# Patient Record
Sex: Male | Born: 1950 | Race: Black or African American | Hispanic: No | Marital: Married | Smoking: Never smoker
Health system: Southern US, Community
[De-identification: ages and names within clinical notes are randomized; demographics above are authoritative.]

## PROBLEM LIST (undated history)

## (undated) DIAGNOSIS — E079 Disorder of thyroid, unspecified: Secondary | ICD-10-CM

## (undated) DIAGNOSIS — I2699 Other pulmonary embolism without acute cor pulmonale: Secondary | ICD-10-CM

## (undated) DIAGNOSIS — I5031 Acute diastolic (congestive) heart failure: Secondary | ICD-10-CM

## (undated) DIAGNOSIS — G4733 Obstructive sleep apnea (adult) (pediatric): Secondary | ICD-10-CM

## (undated) DIAGNOSIS — I10 Essential (primary) hypertension: Secondary | ICD-10-CM

## (undated) DIAGNOSIS — N289 Disorder of kidney and ureter, unspecified: Secondary | ICD-10-CM

## (undated) DIAGNOSIS — M1990 Unspecified osteoarthritis, unspecified site: Secondary | ICD-10-CM

## (undated) DIAGNOSIS — J9601 Acute respiratory failure with hypoxia: Secondary | ICD-10-CM

## (undated) DIAGNOSIS — E119 Type 2 diabetes mellitus without complications: Secondary | ICD-10-CM

## (undated) HISTORY — PX: CHOLECYSTECTOMY: SHX55

## (undated) HISTORY — PX: APPENDECTOMY: SHX54

## (undated) HISTORY — PX: SALIVARY GLAND SURGERY: SHX768

---

## 2012-10-28 DIAGNOSIS — E1142 Type 2 diabetes mellitus with diabetic polyneuropathy: Secondary | ICD-10-CM | POA: Insufficient documentation

## 2013-08-16 DIAGNOSIS — E039 Hypothyroidism, unspecified: Secondary | ICD-10-CM | POA: Insufficient documentation

## 2014-01-16 ENCOUNTER — Emergency Department (HOSPITAL_BASED_OUTPATIENT_CLINIC_OR_DEPARTMENT_OTHER): Payer: Medicare HMO

## 2014-01-16 ENCOUNTER — Encounter (HOSPITAL_BASED_OUTPATIENT_CLINIC_OR_DEPARTMENT_OTHER): Payer: Self-pay | Admitting: Emergency Medicine

## 2014-01-16 ENCOUNTER — Emergency Department (HOSPITAL_BASED_OUTPATIENT_CLINIC_OR_DEPARTMENT_OTHER)
Admission: EM | Admit: 2014-01-16 | Discharge: 2014-01-16 | Disposition: A | Discharge status: Home / Self Care | Payer: Medicare HMO | Attending: Emergency Medicine | Admitting: Emergency Medicine

## 2014-01-16 DIAGNOSIS — Z79899 Other long term (current) drug therapy: Secondary | ICD-10-CM | POA: Insufficient documentation

## 2014-01-16 DIAGNOSIS — IMO0002 Reserved for concepts with insufficient information to code with codable children: Secondary | ICD-10-CM | POA: Diagnosis not present

## 2014-01-16 DIAGNOSIS — Y939 Activity, unspecified: Secondary | ICD-10-CM | POA: Insufficient documentation

## 2014-01-16 DIAGNOSIS — Y929 Unspecified place or not applicable: Secondary | ICD-10-CM | POA: Diagnosis not present

## 2014-01-16 DIAGNOSIS — R109 Unspecified abdominal pain: Secondary | ICD-10-CM

## 2014-01-16 DIAGNOSIS — M129 Arthropathy, unspecified: Secondary | ICD-10-CM | POA: Diagnosis not present

## 2014-01-16 DIAGNOSIS — A599 Trichomoniasis, unspecified: Secondary | ICD-10-CM | POA: Insufficient documentation

## 2014-01-16 DIAGNOSIS — E079 Disorder of thyroid, unspecified: Secondary | ICD-10-CM | POA: Diagnosis not present

## 2014-01-16 DIAGNOSIS — Z9089 Acquired absence of other organs: Secondary | ICD-10-CM | POA: Insufficient documentation

## 2014-01-16 DIAGNOSIS — E119 Type 2 diabetes mellitus without complications: Secondary | ICD-10-CM | POA: Diagnosis not present

## 2014-01-16 DIAGNOSIS — S39011A Strain of muscle, fascia and tendon of abdomen, initial encounter: Secondary | ICD-10-CM

## 2014-01-16 DIAGNOSIS — S3981XA Other specified injuries of abdomen, initial encounter: Secondary | ICD-10-CM | POA: Diagnosis present

## 2014-01-16 DIAGNOSIS — I10 Essential (primary) hypertension: Secondary | ICD-10-CM | POA: Diagnosis not present

## 2014-01-16 HISTORY — DX: Unspecified osteoarthritis, unspecified site: M19.90

## 2014-01-16 HISTORY — DX: Essential (primary) hypertension: I10

## 2014-01-16 HISTORY — DX: Type 2 diabetes mellitus without complications: E11.9

## 2014-01-16 HISTORY — DX: Disorder of thyroid, unspecified: E07.9

## 2014-01-16 LAB — COMPREHENSIVE METABOLIC PANEL
ALBUMIN: 3.5 g/dL (ref 3.5–5.2)
ALK PHOS: 69 U/L (ref 39–117)
ALT: 39 U/L (ref 0–53)
ANION GAP: 13 (ref 5–15)
AST: 36 U/L (ref 0–37)
BUN: 19 mg/dL (ref 6–23)
CO2: 28 mEq/L (ref 19–32)
Calcium: 9.7 mg/dL (ref 8.4–10.5)
Chloride: 102 mEq/L (ref 96–112)
Creatinine, Ser: 2 mg/dL — ABNORMAL HIGH (ref 0.50–1.35)
GFR calc Af Amer: 39 mL/min — ABNORMAL LOW (ref >90)
GFR calc non Af Amer: 34 mL/min — ABNORMAL LOW (ref >90)
Glucose, Bld: 227 mg/dL — ABNORMAL HIGH (ref 70–99)
POTASSIUM: 3.9 meq/L (ref 3.7–5.3)
SODIUM: 143 meq/L (ref 137–147)
TOTAL PROTEIN: 7.8 g/dL (ref 6.0–8.3)
Total Bilirubin: 0.4 mg/dL (ref 0.3–1.2)

## 2014-01-16 LAB — CBC
HCT: 39.3 % (ref 39.0–52.0)
Hemoglobin: 13.6 g/dL (ref 13.0–17.0)
MCH: 26.6 pg (ref 26.0–34.0)
MCHC: 34.6 g/dL (ref 30.0–36.0)
MCV: 76.9 fL — ABNORMAL LOW (ref 78.0–100.0)
Platelets: 188 10*3/uL (ref 150–400)
RBC: 5.11 MIL/uL (ref 4.22–5.81)
RDW: 16.2 % — AB (ref 11.5–15.5)
WBC: 7.6 10*3/uL (ref 4.0–10.5)

## 2014-01-16 LAB — URINE MICROSCOPIC-ADD ON

## 2014-01-16 LAB — URINALYSIS, ROUTINE W REFLEX MICROSCOPIC
Bilirubin Urine: NEGATIVE
Glucose, UA: NEGATIVE mg/dL
Hgb urine dipstick: NEGATIVE
Ketones, ur: NEGATIVE mg/dL
NITRITE: NEGATIVE
PH: 5.5 (ref 5.0–8.0)
Protein, ur: NEGATIVE mg/dL
SPECIFIC GRAVITY, URINE: 1.009 (ref 1.005–1.030)
Urobilinogen, UA: 0.2 mg/dL (ref 0.0–1.0)

## 2014-01-16 MED ORDER — OXYCODONE-ACETAMINOPHEN 5-325 MG PO TABS: 1.0000 | ORAL_TABLET | Freq: Four times a day (QID) | ORAL | Status: DC | PRN

## 2014-01-16 MED ORDER — MORPHINE SULFATE 4 MG/ML IJ SOLN
4.0000 mg | Freq: Once | INTRAMUSCULAR | Status: AC
  Administered 2014-01-16: 4 mg via INTRAVENOUS
  Filled 2014-01-16: qty 1

## 2014-01-16 MED ORDER — DOCUSATE SODIUM 100 MG PO CAPS: 100.0000 mg | ORAL_CAPSULE | Freq: Two times a day (BID) | ORAL | Status: DC

## 2014-01-16 MED ORDER — POLYETHYLENE GLYCOL 3350 17 G PO PACK: 17.0000 g | PACK | Freq: Every day | ORAL | Status: DC

## 2014-01-16 MED ORDER — METRONIDAZOLE 500 MG PO TABS: 500.0000 mg | ORAL_TABLET | Freq: Two times a day (BID) | ORAL | Status: DC

## 2014-01-16 NOTE — ED Notes (Signed)
PA at bedside discussing test results and dispo plan of care. 

## 2014-01-16 NOTE — ED Notes (Signed)
Pt [facility], c/o RLQ pain x 10 days. Denies painful urination. Radiates to back

## 2014-01-16 NOTE — ED Provider Notes (Signed)
CSN: [id number]     Arrival date & time 01/16/14  1423 History   First MD Initiated Contact with Patient 01/16/14 1429     Chief Complaint  Patient presents with  . Abdominal Pain     (Consider location/radiation/quality/duration/timing/severity/associated sxs/prior Treatment) HPI Comments: Patient is a 63 year old male with a past medical history of hypertension, diabetes, thyroid disease and arthritis who presents to the emergency department the EMS complaining of right lower quadrant and right-sided flank pain x10 days, worsening earlier this morning when he woke up to use the restroom. Initially the pain was described as a "soreness", however this morning it increased in severity rated 10 out of 10, nonradiating. States he did not have any difficulty urinating when he used the restroom this morning and again on arrival to the emergency department. No aggravating or alleviating factors. Denies increased frequency, urgency or dysuria. Denies any changes in bowel habits. Denies testicular pain or swelling. No history of kidney stones. Denies chest pain, shortness of breath, nausea, vomiting, fever or chills. History of cholecystectomy and appendectomy.  Patient is a 63 y.o. male presenting with abdominal pain. The history is provided by the patient.  Abdominal Pain   Past Medical History  Diagnosis Date  . Hypertension   . Diabetes mellitus without complication   . Thyroid disease   . Arthritis    Past Surgical History  Procedure Laterality Date  . Cholecystectomy     No family history on file. History  Substance Use Topics  . Smoking status: Never Smoker   . Smokeless tobacco: Current User    Types: Chew  . Alcohol Use: No    Review of Systems  Gastrointestinal: Positive for abdominal pain.  Genitourinary: Positive for flank pain.  All other systems reviewed and are negative.     Allergies  Review of patient's allergies indicates no known allergies.  Home Medications     Prior to Admission medications   Medication Sig Start Date End Date Taking? Authorizing Provider  amLODipine (NORVASC) 10 MG tablet Take 10 mg by mouth daily.   Yes Historical Provider, MD  febuxostat (ULORIC) 40 MG tablet Take 40 mg by mouth daily.   Yes Historical Provider, MD  glipiZIDE (GLUCOTROL) 10 MG tablet Take 10 mg by mouth daily before breakfast.   Yes Historical Provider, MD  levothyroxine (SYNTHROID, LEVOTHROID) 100 MCG tablet Take 100 mcg by mouth daily before breakfast.   Yes Historical Provider, MD  lovastatin (MEVACOR) 40 MG tablet Take 40 mg by mouth at bedtime.   Yes Historical Provider, MD  quinapril (ACCUPRIL) 10 MG tablet Take 10 mg by mouth daily.   Yes Historical Provider, MD  torsemide (DEMADEX) 10 MG tablet Take 10 mg by mouth daily.   Yes Historical Provider, MD  docusate sodium (COLACE) 100 MG capsule Take 1 capsule (100 mg total) by mouth every 12 (twelve) hours. 01/16/14   Trevor Mace, PA-C  oxyCODONE-acetaminophen (PERCOCET) 5-325 MG per tablet Take 1-2 tablets by mouth every 6 (six) hours as needed for severe pain. 01/16/14   Trevor Mace, PA-C  polyethylene glycol (MIRALAX / GLYCOLAX) packet Take 17 g by mouth daily. 01/16/14   Trevor Mace, PA-C   BP 116/68  Pulse 83  Temp(Src) 98.1 F (36.7 C) (Oral)  Resp 16  Ht  (1.778 m)  Wt 293 lb (132.904 kg)  BMI 42.04 kg/m2  SpO2 96% Physical Exam  Nursing note and vitals reviewed. Constitutional: He is oriented to person, place,  and time. He appears well-developed and well-nourished. No distress.  HENT:  Head: Normocephalic and atraumatic.  Mouth/Throat: Oropharynx is clear and moist.  Eyes: Conjunctivae are normal.  Neck: Normal range of motion. Neck supple.  Cardiovascular: Normal rate, regular rhythm and normal heart sounds.   Pulmonary/Chest: Effort normal and breath sounds normal.  Abdominal: Soft. Normal appearance and bowel sounds are normal. He exhibits no distension and no pulsatile  midline mass. There is no rigidity, no rebound and no guarding.    No peritoneal signs.  Musculoskeletal: Normal range of motion. He exhibits no edema.  Neurological: He is alert and oriented to person, place, and time.  Skin: Skin is warm and dry. He is not diaphoretic.  Psychiatric: He has a normal mood and affect. His behavior is normal.    ED Course  Procedures (including critical care time) Labs Review Labs Reviewed  CBC - Abnormal; Notable for the following:    MCV 76.9 (*)    RDW 16.2 (*)    All other components within normal limits  COMPREHENSIVE METABOLIC PANEL - Abnormal; Notable for the following:    Glucose, Bld 227 (*)    Creatinine, Ser 2.00 (*)    GFR calc non Af Amer 34 (*)    GFR calc Af Amer 39 (*)    All other components within normal limits  URINALYSIS, ROUTINE W REFLEX MICROSCOPIC - Abnormal; Notable for the following:    Leukocytes, UA TRACE (*)    All other components within normal limits  URINE MICROSCOPIC-ADD ON - Abnormal; Notable for the following:    Bacteria, UA FEW (*)    All other components within normal limits  URINE CULTURE    Imaging Review Ct Renal Stone Study  01/16/2014   CLINICAL DATA:  Right flank pain x2 weeks  EXAM: CT RENAL STONE PROTOCOL  TECHNIQUE: Multidetector CT imaging of the abdomen and pelvis was performed following the standard protocol without intravenous contrast  COMPARISON:  None.  FINDINGS: Lung bases are clear.  Moderate hepatic steatosis.  Spleen, pancreas, and adrenal glands are within normal limits.  Status post cholecystectomy.  Kidneys are unremarkable.  No renal calculi or hydronephrosis.  No evidence of bowel obstruction. Colonic diverticulosis, without associated inflammatory changes. Prior appendectomy.  No evidence of abdominal aortic aneurysm.  No abdominopelvic ascites.  No suspicious abdominopelvic lymphadenopathy.  Prostatomegaly, with enlargement of the central gland which indents the base of the bladder.  No  ureteral or bladder calculi.  Bladder is within normal limits.  Degenerative changes of the visualized thoracolumbar spine.  IMPRESSION: No renal, ureteral, or bladder calculi.  No hydronephrosis.  No evidence of bowel obstruction.  Prior appendectomy.  Moderate hepatic steatosis.   Electronically Signed   By: Charline Bills M.D.   On: 01/16/2014 15:58     EKG Interpretation None      MDM   Final diagnoses:  Abdominal wall strain, initial encounter  Flank pain   Patient presenting with right-sided flank and abdominal pain. He is nontoxic appearing and in no apparent distress. Afebrile, vital signs stable. Right-sided tenderness noted without peritoneal signs. Labs, CT abd/pelvis without contrast pending. 4:17 PM CT scan without any acute finding. No leukocytosis. Trace leukocytes, few bacteria and 3-6 white blood cells on urinalysis with rare squamous epithelial cells. Trichomonas present. Urine culture pending. Will treat Trichomonas with Flagyl. I discussed these findings with patient, he reports he has not been sexually active "for years", however agreeable the antibiotic. No urinary symptoms. Patient  is aware of his poor renal functions, his primary care physician is following this. When discussing CT scan results, and managing possibility of a muscle strain, patient reports he had some constipation a few days ago causing him to strain significant amount. No hernias palpated. Patient is stable for discharge with antibiotic, short course of Percocet, Colace and MiraLax. He will followup with his PCP. Return precautions given. Patient states understanding of treatment care plan and is agreeable.  Trevor Mace, PA-C 01/16/14 1624

## 2014-01-16 NOTE — ED Provider Notes (Signed)
Medical screening examination/treatment/procedure(s) were performed by non-physician practitioner and as supervising physician I was immediately available for consultation/collaboration.   EKG Interpretation None        Toy Cookey, MD 01/16/14 1624

## 2014-01-16 NOTE — Discharge Instructions (Signed)
Take percocet for severe pain only. No driving or operating heavy machinery while taking percocet. This medication may cause drowsiness. Take miralax as prescribed to prevent constipation along with colace. Take antibiotic to completion. You are obligated to inform your partner about trichomoniasis.  Abdominal Pain Many things can cause abdominal pain. Usually, abdominal pain is not caused by a disease and will improve without treatment. It can often be observed and treated at home. Your health care provider will do a physical exam and possibly order blood tests and X-rays to help determine the seriousness of your pain. However, in many cases, more time must pass before a clear cause of the pain can be found. Before that point, your health care provider may not know if you need more testing or further treatment. HOME CARE INSTRUCTIONS  Monitor your abdominal pain for any changes. The following actions may help to alleviate any discomfort you are experiencing:  Only take over-the-counter or prescription medicines as directed by your health care provider.  Do not take laxatives unless directed to do so by your health care provider.  Try a clear liquid diet (broth, tea, or water) as directed by your health care provider. Slowly move to a bland diet as tolerated. SEEK MEDICAL CARE IF:  You have unexplained abdominal pain.  You have abdominal pain associated with nausea or diarrhea.  You have pain when you urinate or have a bowel movement.  You experience abdominal pain that wakes you in the night.  You have abdominal pain that is worsened or improved by eating food.  You have abdominal pain that is worsened with eating fatty foods.  You have a fever. SEEK IMMEDIATE MEDICAL CARE IF:   Your pain does not go away within 2 hours.  You keep throwing up (vomiting).  Your pain is felt only in portions of the abdomen, such as the right side or the left lower portion of the abdomen.  You pass  bloody or black tarry stools. MAKE SURE YOU:  Understand these instructions.   Will watch your condition.   Will get help right away if you are not doing well or get worse.  Document Released: 02/17/2005 Document Revised: 05/15/2013 Document Reviewed: 01/17/2013 [facility] Patient Information 2015 [place], [place]. This information is not intended to replace advice given to you by your health care provider. Make sure you discuss any questions you have with your health care provider.  Flank Pain Flank pain refers to pain that is located on the side of the body between the upper abdomen and the back. The pain may occur over a short period of time (acute) or may be long-term or reoccurring (chronic). It may be mild or severe. Flank pain can be caused by many things. CAUSES  Some of the more common causes of flank pain include:  Muscle strains.   Muscle spasms.   A disease of your spine (vertebral disk disease).   A lung infection (pneumonia).   Fluid around your lungs (pulmonary edema).   A kidney infection.   Kidney stones.   A very painful skin rash caused by the chickenpox virus (shingles).   Gallbladder disease.  HOME CARE INSTRUCTIONS  Home care will depend on the cause of your pain. In general,  Rest as directed by your caregiver.  Drink enough fluids to keep your urine clear or pale yellow.  Only take over-the-counter or prescription medicines as directed by your caregiver. Some medicines may help relieve the pain.  Tell your caregiver about any  changes in your pain.  Follow up with your caregiver as directed. SEEK IMMEDIATE MEDICAL CARE IF:   Your pain is not controlled with medicine.   You have new or worsening symptoms.  Your pain increases.   You have abdominal pain.   You have shortness of breath.   You have persistent nausea or vomiting.   You have swelling in your abdomen.   You feel faint or pass out.   You have blood in your  urine.  You have a fever or persistent symptoms for more than 2-3 days.  You have a fever and your symptoms suddenly get worse. MAKE SURE YOU:   Understand these instructions.  Will watch your condition.  Will get help right away if you are not doing well or get worse. Document Released: 07/01/2005 Document Revised: 02/02/2012 Document Reviewed: 12/23/2011 [facility] Patient Information 2015 [place], [place]. This information is not intended to replace advice given to you by your health care provider. Make sure you discuss any questions you have with your health care provider.  Muscle Strain A muscle strain (pulled muscle) happens when a muscle is stretched beyond normal length. It happens when a sudden, violent force stretches your muscle too far. Usually, a few of the fibers in your muscle are torn. Muscle strain is common in athletes. Recovery usually takes 1-2 weeks. Complete healing takes 5-6 weeks.  HOME CARE   Follow the PRICE method of treatment to help your injury get better. Do this the first 2-3 days after the injury:  Protect. Protect the muscle to keep it from getting injured again.  Rest. Limit your activity and rest the injured body part.  Ice. Put ice in a plastic bag. Place a towel between your skin and the bag. Then, apply the ice and leave it on from 15-20 minutes each hour. After the third day, switch to moist heat packs.  Compression. Use a splint or elastic bandage on the injured area for comfort. Do not put it on too tightly.  Elevate. Keep the injured body part above the level of your heart.  Only take medicine as told by your doctor.  Warm up before doing exercise to prevent future muscle strains. GET HELP IF:   You have more pain or puffiness (swelling) in the injured area.  You feel numbness, tingling, or notice a loss of strength in the injured area. MAKE SURE YOU:   Understand these instructions.  Will watch your condition.  Will get help right  away if you are not doing well or get worse. Document Released: 02/17/2008 Document Revised: 02/28/2013 Document Reviewed: 12/07/2012 [facility] Patient Information 2015 [place], [place]. This information is not intended to replace advice given to you by your health care provider. Make sure you discuss any questions you have with your health care provider.  Trichomoniasis Trichomoniasis is an infection caused by an organism called Trichomonas. The infection can affect both women and men. In women, the outer male genitalia and the vagina are affected. In men, the penis is mainly affected, but the prostate and other reproductive organs can also be involved. Trichomoniasis is a sexually transmitted infection (STI) and is most often passed to another person through sexual contact.  RISK FACTORS  Having unprotected sexual intercourse.  Having sexual intercourse with an infected partner. SIGNS AND SYMPTOMS  Symptoms of trichomoniasis in women include:  Abnormal gray-green frothy vaginal discharge.  Itching and irritation of the vagina.  Itching and irritation of the area outside the vagina. Symptoms of  trichomoniasis in men include:   Penile discharge with or without pain.  Pain during urination. This results from inflammation of the urethra. DIAGNOSIS  Trichomoniasis may be found during a Pap test or physical exam. Your health care provider may use one of the following methods to help diagnose this infection:  Examining vaginal discharge under a microscope. For men, urethral discharge would be examined.  Testing the pH of the vagina with a test tape.  Using a vaginal swab test that checks for the Trichomonas organism. A test is available that provides results within a few minutes.  Doing a culture test for the organism. This is not usually needed. TREATMENT   You may be given medicine to fight the infection. Women should inform their health care provider if they could be or are  pregnant. Some medicines used to treat the infection should not be taken during pregnancy.  Your health care provider may recommend over-the-counter medicines or creams to decrease itching or irritation.  Your sexual partner will need to be treated if infected. HOME CARE INSTRUCTIONS   Take medicines only as directed by your health care provider.  Take over-the-counter medicine for itching or irritation as directed by your health care provider.  Do not have sexual intercourse while you have the infection.  Women should not douche or wear tampons while they have the infection.  Discuss your infection with your partner. Your partner may have gotten the infection from you, or you may have gotten it from your partner.  Have your sex partner get examined and treated if necessary.  Practice safe, informed, and protected sex.  See your health care provider for other STI testing. SEEK MEDICAL CARE IF:   You still have symptoms after you finish your medicine.  You develop abdominal pain.  You have pain when you urinate.  You have bleeding after sexual intercourse.  You develop a rash.  Your medicine makes you sick or makes you throw up (vomit). MAKE SURE YOU:  Understand these instructions.  Will watch your condition.  Will get help right away if you are not doing well or get worse. Document Released: 11/03/2000 Document Revised: 09/24/2013 Document Reviewed: 02/19/2013 [facility] Patient Information 2015 [place], [place]. This information is not intended to replace advice given to you by your health care provider. Make sure you discuss any questions you have with your health care provider.

## 2014-01-18 LAB — URINE CULTURE
Colony Count: NO GROWTH
Culture: NO GROWTH

## 2015-11-10 DIAGNOSIS — G4733 Obstructive sleep apnea (adult) (pediatric): Secondary | ICD-10-CM | POA: Insufficient documentation

## 2017-07-06 ENCOUNTER — Encounter (HOSPITAL_BASED_OUTPATIENT_CLINIC_OR_DEPARTMENT_OTHER): Payer: Self-pay

## 2017-07-06 ENCOUNTER — Emergency Department (HOSPITAL_BASED_OUTPATIENT_CLINIC_OR_DEPARTMENT_OTHER)
Admission: EM | Admit: 2017-07-06 | Discharge: 2017-07-06 | Disposition: A | Discharge status: Home / Self Care | Payer: Medicare HMO | Attending: Emergency Medicine | Admitting: Emergency Medicine

## 2017-07-06 ENCOUNTER — Emergency Department (HOSPITAL_BASED_OUTPATIENT_CLINIC_OR_DEPARTMENT_OTHER): Payer: Medicare HMO

## 2017-07-06 ENCOUNTER — Other Ambulatory Visit: Payer: Self-pay

## 2017-07-06 DIAGNOSIS — E119 Type 2 diabetes mellitus without complications: Secondary | ICD-10-CM | POA: Insufficient documentation

## 2017-07-06 DIAGNOSIS — Z7984 Long term (current) use of oral hypoglycemic drugs: Secondary | ICD-10-CM | POA: Diagnosis not present

## 2017-07-06 DIAGNOSIS — J069 Acute upper respiratory infection, unspecified: Secondary | ICD-10-CM | POA: Insufficient documentation

## 2017-07-06 DIAGNOSIS — Z7982 Long term (current) use of aspirin: Secondary | ICD-10-CM | POA: Diagnosis not present

## 2017-07-06 DIAGNOSIS — Z79899 Other long term (current) drug therapy: Secondary | ICD-10-CM | POA: Insufficient documentation

## 2017-07-06 DIAGNOSIS — B9789 Other viral agents as the cause of diseases classified elsewhere: Secondary | ICD-10-CM

## 2017-07-06 DIAGNOSIS — R05 Cough: Secondary | ICD-10-CM | POA: Diagnosis present

## 2017-07-06 DIAGNOSIS — I10 Essential (primary) hypertension: Secondary | ICD-10-CM | POA: Insufficient documentation

## 2017-07-06 MED ORDER — ALBUTEROL SULFATE HFA 108 (90 BASE) MCG/ACT IN AERS
2.0000 | INHALATION_SPRAY | RESPIRATORY_TRACT | Status: DC | PRN
  Administered 2017-07-06: 2 via RESPIRATORY_TRACT
  Filled 2017-07-06: qty 6.7

## 2017-07-06 MED ORDER — BENZONATATE 100 MG PO CAPS: 100.0000 mg | ORAL_CAPSULE | Freq: Three times a day (TID) | ORAL | 0 refills | Status: DC

## 2017-07-06 MED FILL — BENZONATATE 100 MG CAPSULE: 100 | 5 days supply | Qty: 15 | Fill #0

## 2017-07-06 NOTE — ED Triage Notes (Signed)
C/o fu like sx day 6-NAD-presents to triage in w/c however drove to ED

## 2017-07-06 NOTE — Discharge Instructions (Signed)
Please read and follow all provided instructions.  Your diagnoses today include:  1. Viral URI with cough    Tests performed today include:  Chest x-ray - does not show any pneumonia or fluid overload  Vital signs. See below for your results today.   Medications prescribed:   Tessalon Perles - cough suppressant medication   Albuterol inhaler - medication that opens up your airway  Use inhaler as follows: 1-2 puffs with spacer every 4 hours as needed for wheezing, cough, or shortness of breath.   Take any prescribed medications only as directed.  Home care instructions:  Follow any educational materials contained in this packet.  Follow-up instructions: Please follow-up with your primary care provider in the next 3 days for further evaluation of your symptoms and a recheck if you are not feeling better.   Return instructions:   Please return to the Emergency Department if you experience worsening symptoms.  Please return with worsening wheezing, shortness of breath, or difficulty breathing.  Return with persistent fever above 101F.   Please return if you have any other emergent concerns.  Additional Information:  Your vital signs today were: BP 112/62 (BP Location: Right Arm)    Pulse 80    Temp 98.3 F (36.8 C) (Oral)    Resp 18    Ht 5\' 10"  (1.778 m)    Wt (!) 144.7 kg (319 lb)    SpO2 95%    BMI 45.77 kg/m  If your blood pressure (BP) was elevated above 135/85 this visit, please have this repeated by your doctor within one month. --------------

## 2017-07-06 NOTE — ED Provider Notes (Signed)
[facility] EMERGENCY DEPARTMENT Provider Note   CSN: [id number] Arrival date & time: 07/06/17  1214     History   Chief Complaint Chief Complaint  Patient presents with  . Cough    HPI Todd Chambers is a 67 y.o. male.  Patient with history of diabetes, hypertension, history of previous pneumonia --presents to the emergency department with URI symptoms including nasal congestion, runny nose, and nonproductive cough over the past 6 days.  He reports occasional wheezing.  Patient has not had any reported fevers, ear pain, sore throat.  No chest pain or shortness of breath.  He denies any lower extremity swelling or increasing urination.  No weight changes.  He has taken an over-the-counter decongestant without relief.  No other treatments.  Patient states he was concerned that he was getting pneumonia again so he came to the emergency department.  Onset of symptoms acute.  Course is constant.  Nothing makes symptoms better or worse.      Past Medical History:  Diagnosis Date  . Arthritis   . Diabetes mellitus without complication (HCC)   . Hypertension   . Thyroid disease     There are no active problems to display for this patient.   Past Surgical History:  Procedure Laterality Date  . CHOLECYSTECTOMY         Home Medications    Prior to Admission medications   Medication Sig Start Date End Date Taking? Authorizing Provider  aspirin 81 MG chewable tablet Chew by mouth daily.   Yes [provider]  Cholecalciferol (VITAMIN D PO) Take by mouth.   Yes [provider]  Omega-3 Fatty Acids (FISH OIL PO) Take by mouth.   Yes [provider]  amLODipine (NORVASC) 10 MG tablet Take 10 mg by mouth daily.    [provider]  benzonatate (TESSALON) 100 MG capsule Take 1 capsule (100 mg total) by mouth every 8 (eight) hours. 07/06/17   Renne Crigler, PA-C  febuxostat (ULORIC) 40 MG tablet Take 40 mg by mouth daily.    [provider]  glipiZIDE (GLUCOTROL) 10 MG tablet Take 10 mg by mouth daily before breakfast.    [provider]  levothyroxine (SYNTHROID, LEVOTHROID) 100 MCG tablet Take 100 mcg by mouth daily before breakfast.    [provider]  lovastatin (MEVACOR) 40 MG tablet Take 40 mg by mouth at bedtime.    [provider]  oxyCODONE-acetaminophen (PERCOCET) 5-325 MG per tablet Take 1-2 tablets by mouth every 6 (six) hours as needed for severe pain. 01/16/14   Hess, Nada Boozer, PA-C  quinapril (ACCUPRIL) 10 MG tablet Take 10 mg by mouth daily.    [provider]  torsemide (DEMADEX) 10 MG tablet Take 10 mg by mouth daily.    [provider]    Family History No family history on file.  Social History Social History   Tobacco Use  . Smoking status: Never Smoker  . Smokeless tobacco: Former Engineer, water Use Topics  . Alcohol use: No  . Drug use: No     Allergies   Penicillins   Review of Systems Review of Systems  Constitutional: Negative for chills, fatigue and fever.  HENT: Positive for congestion. Negative for ear pain, rhinorrhea, sinus pressure and sore throat.   Eyes: Negative for redness.  Respiratory: Positive for cough and wheezing.   Cardiovascular: Negative for chest pain and leg swelling.  Gastrointestinal: Negative for abdominal pain, diarrhea, nausea and vomiting.  Genitourinary: Negative for dysuria.  Musculoskeletal: Negative for myalgias and neck stiffness.  Skin: Negative for rash.  Neurological: Negative for headaches.  Hematological: Negative for adenopathy.     Physical Exam Updated Vital Signs BP 112/62 (BP Location: Right Arm)   Pulse 80   Temp 98.3 F (36.8 C) (Oral)   Resp 18   Ht 5\' 10"  (1.778 m)   Wt (!) 144.7 kg (319 lb)   SpO2 95%   BMI 45.77 kg/m   Physical Exam  Constitutional: He appears well-developed and well-nourished.  HENT:  Head: Normocephalic and atraumatic.  Right Ear: Tympanic  membrane, external ear and ear canal normal.  Left Ear: Tympanic membrane, external ear and ear canal normal.  Nose: Mucosal edema present. No rhinorrhea.  Mouth/Throat: Uvula is midline, oropharynx is clear and moist and mucous membranes are normal. Mucous membranes are not dry. No trismus in the jaw. No uvula swelling. No oropharyngeal exudate, posterior oropharyngeal edema, posterior oropharyngeal erythema or tonsillar abscesses.  Eyes: Conjunctivae are normal. Right eye exhibits no discharge. Left eye exhibits no discharge.  Neck: Normal range of motion. Neck supple.  Cardiovascular: Normal rate, regular rhythm and normal heart sounds.  Pulmonary/Chest: Effort normal and breath sounds normal. No respiratory distress. He has no wheezes. He has no rales.  Occasional cough during exam. No respiratory distress.   Abdominal: Soft. There is no tenderness. There is no rebound and no guarding.  Musculoskeletal: He exhibits no edema or tenderness.  No significant lower extremity tenderness or swelling.  Neurological: He is alert.  Skin: Skin is warm and dry.  Psychiatric: He has a normal mood and affect.  Nursing note and vitals reviewed.    ED Treatments / Results  Labs (all labs ordered are listed, but only abnormal results are displayed) Labs Reviewed - No data to display  EKG  EKG Interpretation None       Radiology Dg Chest 2 View  Result Date: 07/06/2017 CLINICAL DATA:  Flu like symptoms for the past 6 days. History of diabetes, nonsmoker. EXAM: CHEST  2 VIEW COMPARISON:  Report of a chest x-ray of August 12, 2010 FINDINGS: The lungs are adequately inflated. There is no focal infiltrate. There is no pleural effusion. The heart and pulmonary vascularity are normal. The mediastinum is normal in width. There is no pleural effusion. There is calcification of the anterior longitudinal ligament of the thoracic spine. IMPRESSION: There is no pneumonia nor other acute cardiopulmonary  abnormality. Electronically Signed   By: David  SwazilandJordan M.D.   On: 07/06/2017 12:43    Procedures Procedures (including critical care time)  Medications Ordered in ED Medications  albuterol (PROVENTIL HFA;VENTOLIN HFA) 108 (90 Base) MCG/ACT inhaler 2 puff (2 puffs Inhalation Given 07/06/17 1310)     Initial Impression / Assessment and Plan / ED Course  I have reviewed the triage vital signs and the nursing notes.  Pertinent labs & imaging results that were available during my care of the patient were reviewed by me and considered in my medical decision making (see chart for details).     Patient seen and examined.  Chest x-ray is clear.  Will treat patient symptomatically with tessalon and albuterol.  Vital signs reviewed and are as follows: BP 112/62 (BP Location: Right Arm)   Pulse 80   Temp 98.3 F (36.8 C) (Oral)   Resp 18   Ht 5\' 10"  (1.778 m)   Wt (!) 144.7 kg (319 lb)   SpO2 95%   BMI  45.77 kg/m   Patient urged to return with worsening symptoms or other concerns. Patient verbalized understanding and agrees with plan.    Final Clinical Impressions(s) / ED Diagnoses   Final diagnoses:  Viral URI with cough   Patient with URI symptoms and cough for the past 6 days.  Chest x-ray does not show any signs of pneumonia.  Patient has not had any reported fevers.  This is not appear to be a CHF exacerbation, no lower extremity edema or pulmonary edema on chest x-ray.  Do not suggest PE or ACS.  Treatment as above.    ED Discharge Orders        Ordered    benzonatate (TESSALON) 100 MG capsule  Every 8 hours     07/06/17 1305       Renne Crigler, PA-C 07/06/17 1333    Cardama, Amadeo Garnet, MD 07/09/17 [phone]

## 2018-05-25 DIAGNOSIS — N183 Chronic kidney disease, stage 3 (moderate): Secondary | ICD-10-CM | POA: Diagnosis not present

## 2018-05-25 DIAGNOSIS — E559 Vitamin D deficiency, unspecified: Secondary | ICD-10-CM | POA: Diagnosis not present

## 2018-05-25 DIAGNOSIS — I10 Essential (primary) hypertension: Secondary | ICD-10-CM | POA: Diagnosis not present

## 2018-05-29 DIAGNOSIS — I10 Essential (primary) hypertension: Secondary | ICD-10-CM | POA: Diagnosis not present

## 2018-05-29 DIAGNOSIS — N183 Chronic kidney disease, stage 3 (moderate): Secondary | ICD-10-CM | POA: Diagnosis not present

## 2018-05-29 DIAGNOSIS — N390 Urinary tract infection, site not specified: Secondary | ICD-10-CM | POA: Diagnosis not present

## 2018-06-20 DIAGNOSIS — H401131 Primary open-angle glaucoma, bilateral, mild stage: Secondary | ICD-10-CM | POA: Diagnosis not present

## 2018-06-22 DIAGNOSIS — N183 Chronic kidney disease, stage 3 (moderate): Secondary | ICD-10-CM | POA: Diagnosis not present

## 2018-06-22 DIAGNOSIS — I10 Essential (primary) hypertension: Secondary | ICD-10-CM | POA: Diagnosis not present

## 2018-06-22 DIAGNOSIS — E119 Type 2 diabetes mellitus without complications: Secondary | ICD-10-CM | POA: Diagnosis not present

## 2018-06-26 DIAGNOSIS — I10 Essential (primary) hypertension: Secondary | ICD-10-CM | POA: Diagnosis not present

## 2018-06-26 DIAGNOSIS — N183 Chronic kidney disease, stage 3 (moderate): Secondary | ICD-10-CM | POA: Diagnosis not present

## 2018-06-26 DIAGNOSIS — N390 Urinary tract infection, site not specified: Secondary | ICD-10-CM | POA: Diagnosis not present

## 2018-06-26 DIAGNOSIS — E119 Type 2 diabetes mellitus without complications: Secondary | ICD-10-CM | POA: Diagnosis not present

## 2018-08-02 ENCOUNTER — Other Ambulatory Visit: Payer: Self-pay | Admitting: *Deleted

## 2018-08-02 NOTE — Patient Outreach (Signed)
  [facility]) Care Management Chronic Special Needs Program  08/02/2018  Name: Todd Chambers DOB: 03-08-1951  MRN: [id number]  Mr. Todd Chambers is enrolled in a chronic special needs plan for  Diabetes. A completed health risk assessment has not been received from the client and client has not responded to outreach attempts by their health care concierge.  The client's individualized care plan was developed based on available data.  Plan: . Send unsuccessful outreach letter with a copy of individualized care plan to client . Send Agricultural engineer on HTN and an advanced directive packet . Send individualized care plan to provider  Chronic care management coordinator, Dudley Major,  will attempt outreach in 2-4 months.   Bary Richard RN,CCM,CDE [facility] Care Management Coordinator Office Phone [phone] Office Fax [phone]

## 2018-08-03 DIAGNOSIS — I10 Essential (primary) hypertension: Secondary | ICD-10-CM | POA: Diagnosis not present

## 2018-08-03 DIAGNOSIS — N183 Chronic kidney disease, stage 3 (moderate): Secondary | ICD-10-CM | POA: Diagnosis not present

## 2018-08-03 DIAGNOSIS — E119 Type 2 diabetes mellitus without complications: Secondary | ICD-10-CM | POA: Diagnosis not present

## 2018-08-03 DIAGNOSIS — R0602 Shortness of breath: Secondary | ICD-10-CM | POA: Diagnosis not present

## 2018-08-03 NOTE — Patient Outreach (Signed)
[facility]) Care Management  08/02/18  Manvik Kozloff [birth year] [id number]   Opened this encounter in wrong context.  Please see the other note of today.  Bary Richard RN,CCM,CDE [facility] Care Management Coordinator Office Phone [phone] Office Fax [phone]

## 2018-08-05 ENCOUNTER — Encounter (HOSPITAL_BASED_OUTPATIENT_CLINIC_OR_DEPARTMENT_OTHER): Payer: Self-pay | Admitting: Emergency Medicine

## 2018-08-05 ENCOUNTER — Emergency Department (HOSPITAL_BASED_OUTPATIENT_CLINIC_OR_DEPARTMENT_OTHER): Payer: HMO

## 2018-08-05 ENCOUNTER — Other Ambulatory Visit: Payer: Self-pay

## 2018-08-05 ENCOUNTER — Observation Stay (HOSPITAL_BASED_OUTPATIENT_CLINIC_OR_DEPARTMENT_OTHER): Payer: HMO

## 2018-08-05 ENCOUNTER — Observation Stay (HOSPITAL_BASED_OUTPATIENT_CLINIC_OR_DEPARTMENT_OTHER)
Admission: EM | Admit: 2018-08-05 | Discharge: 2018-08-07 | Disposition: A | Discharge status: Home / Self Care | Payer: HMO | Attending: Internal Medicine | Admitting: Internal Medicine

## 2018-08-05 DIAGNOSIS — I5031 Acute diastolic (congestive) heart failure: Secondary | ICD-10-CM

## 2018-08-05 DIAGNOSIS — E669 Obesity, unspecified: Secondary | ICD-10-CM | POA: Insufficient documentation

## 2018-08-05 DIAGNOSIS — I10 Essential (primary) hypertension: Secondary | ICD-10-CM | POA: Diagnosis not present

## 2018-08-05 DIAGNOSIS — E1169 Type 2 diabetes mellitus with other specified complication: Secondary | ICD-10-CM | POA: Diagnosis present

## 2018-08-05 DIAGNOSIS — R0602 Shortness of breath: Secondary | ICD-10-CM | POA: Diagnosis present

## 2018-08-05 DIAGNOSIS — I509 Heart failure, unspecified: Secondary | ICD-10-CM | POA: Diagnosis not present

## 2018-08-05 DIAGNOSIS — Z9049 Acquired absence of other specified parts of digestive tract: Secondary | ICD-10-CM | POA: Insufficient documentation

## 2018-08-05 DIAGNOSIS — E1122 Type 2 diabetes mellitus with diabetic chronic kidney disease: Secondary | ICD-10-CM | POA: Diagnosis not present

## 2018-08-05 DIAGNOSIS — J9601 Acute respiratory failure with hypoxia: Secondary | ICD-10-CM | POA: Diagnosis not present

## 2018-08-05 DIAGNOSIS — E039 Hypothyroidism, unspecified: Secondary | ICD-10-CM | POA: Diagnosis not present

## 2018-08-05 DIAGNOSIS — I2699 Other pulmonary embolism without acute cor pulmonale: Secondary | ICD-10-CM | POA: Diagnosis not present

## 2018-08-05 DIAGNOSIS — I2602 Saddle embolus of pulmonary artery with acute cor pulmonale: Secondary | ICD-10-CM

## 2018-08-05 DIAGNOSIS — Z6841 Body Mass Index (BMI) 40.0 and over, adult: Secondary | ICD-10-CM | POA: Diagnosis not present

## 2018-08-05 DIAGNOSIS — Z79899 Other long term (current) drug therapy: Secondary | ICD-10-CM | POA: Diagnosis not present

## 2018-08-05 DIAGNOSIS — Z7984 Long term (current) use of oral hypoglycemic drugs: Secondary | ICD-10-CM | POA: Diagnosis not present

## 2018-08-05 DIAGNOSIS — N183 Chronic kidney disease, stage 3 unspecified: Secondary | ICD-10-CM | POA: Diagnosis present

## 2018-08-05 DIAGNOSIS — R0600 Dyspnea, unspecified: Secondary | ICD-10-CM | POA: Diagnosis not present

## 2018-08-05 DIAGNOSIS — I130 Hypertensive heart and chronic kidney disease with heart failure and stage 1 through stage 4 chronic kidney disease, or unspecified chronic kidney disease: Secondary | ICD-10-CM | POA: Diagnosis not present

## 2018-08-05 DIAGNOSIS — Z7982 Long term (current) use of aspirin: Secondary | ICD-10-CM | POA: Diagnosis not present

## 2018-08-05 DIAGNOSIS — I2693 Single subsegmental pulmonary embolism without acute cor pulmonale: Secondary | ICD-10-CM | POA: Diagnosis not present

## 2018-08-05 HISTORY — DX: Obstructive sleep apnea (adult) (pediatric): G47.33

## 2018-08-05 HISTORY — DX: Acute diastolic (congestive) heart failure: I50.31

## 2018-08-05 HISTORY — DX: Disorder of kidney and ureter, unspecified: N28.9

## 2018-08-05 HISTORY — DX: Acute respiratory failure with hypoxia: J96.01

## 2018-08-05 HISTORY — DX: Other pulmonary embolism without acute cor pulmonale: I26.99

## 2018-08-05 LAB — D-DIMER, QUANTITATIVE: D-Dimer, Quant: 1.36 ug/mL-FEU — ABNORMAL HIGH (ref 0.00–0.50)

## 2018-08-05 LAB — BASIC METABOLIC PANEL
Anion gap: 6 (ref 5–15)
BUN: 15 mg/dL (ref 8–23)
CO2: 31 mmol/L (ref 22–32)
Calcium: 9 mg/dL (ref 8.9–10.3)
Chloride: 101 mmol/L (ref 98–111)
Creatinine, Ser: 1.55 mg/dL — ABNORMAL HIGH (ref 0.61–1.24)
GFR calc Af Amer: 52 mL/min — ABNORMAL LOW (ref >60)
GFR calc non Af Amer: 45 mL/min — ABNORMAL LOW (ref >60)
GLUCOSE: 174 mg/dL — AB (ref 70–99)
POTASSIUM: 3.8 mmol/L (ref 3.5–5.1)
Sodium: 138 mmol/L (ref 135–145)

## 2018-08-05 LAB — CBC WITH DIFFERENTIAL/PLATELET
Abs Immature Granulocytes: 0.02 10*3/uL (ref 0.00–0.07)
BASOS PCT: 1 %
Basophils Absolute: 0.1 10*3/uL (ref 0.0–0.1)
EOS ABS: 0.1 10*3/uL (ref 0.0–0.5)
Eosinophils Relative: 2 %
HCT: 46.4 % (ref 39.0–52.0)
Hemoglobin: 14.2 g/dL (ref 13.0–17.0)
Immature Granulocytes: 0 %
Lymphocytes Relative: 34 %
Lymphs Abs: 2.4 10*3/uL (ref 0.7–4.0)
MCH: 24.8 pg — ABNORMAL LOW (ref 26.0–34.0)
MCHC: 30.6 g/dL (ref 30.0–36.0)
MCV: 81 fL (ref 80.0–100.0)
MONO ABS: 0.8 10*3/uL (ref 0.1–1.0)
MONOS PCT: 11 %
Neutro Abs: 3.6 10*3/uL (ref 1.7–7.7)
Neutrophils Relative %: 52 %
PLATELETS: 202 10*3/uL (ref 150–400)
RBC: 5.73 MIL/uL (ref 4.22–5.81)
RDW: 17.9 % — ABNORMAL HIGH (ref 11.5–15.5)
WBC: 6.9 10*3/uL (ref 4.0–10.5)
nRBC: 0 % (ref 0.0–0.2)

## 2018-08-05 LAB — BRAIN NATRIURETIC PEPTIDE: B Natriuretic Peptide: 39.4 pg/mL (ref 0.0–100.0)

## 2018-08-05 LAB — TROPONIN I: Troponin I: <0.03 ng/mL (ref <0.03)

## 2018-08-05 LAB — GLUCOSE, CAPILLARY
Glucose-Capillary: 130 mg/dL — ABNORMAL HIGH (ref 70–99)
Glucose-Capillary: 192 mg/dL — ABNORMAL HIGH (ref 70–99)
Glucose-Capillary: 243 mg/dL — ABNORMAL HIGH (ref 70–99)
Glucose-Capillary: 270 mg/dL — ABNORMAL HIGH (ref 70–99)

## 2018-08-05 LAB — HEPARIN LEVEL (UNFRACTIONATED): Heparin Unfractionated: 0.89 IU/mL — ABNORMAL HIGH (ref 0.30–0.70)

## 2018-08-05 LAB — ECHOCARDIOGRAM COMPLETE
Height: 70 in
Weight: 4955.2 oz

## 2018-08-05 LAB — HEMOGLOBIN A1C
HEMOGLOBIN A1C: 7.5 % — AB (ref 4.8–5.6)
Mean Plasma Glucose: 168.55 mg/dL

## 2018-08-05 MED ORDER — ACETAMINOPHEN 325 MG PO TABS: 650.0000 mg | ORAL_TABLET | Freq: Four times a day (QID) | ORAL | Status: DC | PRN

## 2018-08-05 MED ORDER — LEVOTHYROXINE SODIUM 100 MCG PO TABS
100.0000 ug | ORAL_TABLET | Freq: Every day | ORAL | Status: DC
  Administered 2018-08-06 – 2018-08-07 (×2): 100 ug via ORAL
  Filled 2018-08-05 (×2): qty 1

## 2018-08-05 MED ORDER — HEPARIN (PORCINE) 25000 UT/250ML-% IV SOLN
1500.0000 [IU]/h | INTRAVENOUS | Status: AC
  Administered 2018-08-05: 1700 [IU]/h via INTRAVENOUS

## 2018-08-05 MED ORDER — FUROSEMIDE 10 MG/ML IJ SOLN
40.0000 mg | Freq: Once | INTRAMUSCULAR | Status: AC
  Administered 2018-08-05: 40 mg via INTRAVENOUS
  Filled 2018-08-05: qty 4

## 2018-08-05 MED ORDER — BENZONATATE 100 MG PO CAPS
100.0000 mg | ORAL_CAPSULE | Freq: Three times a day (TID) | ORAL | Status: DC
  Administered 2018-08-05 – 2018-08-07 (×7): 100 mg via ORAL
  Filled 2018-08-05 (×7): qty 1

## 2018-08-05 MED ORDER — INSULIN ASPART 100 UNIT/ML ~~LOC~~ SOLN
0.0000 [IU] | Freq: Every day | SUBCUTANEOUS | Status: DC
  Administered 2018-08-05: 3 [IU] via SUBCUTANEOUS
  Administered 2018-08-06: 2 [IU] via SUBCUTANEOUS

## 2018-08-05 MED ORDER — PERFLUTREN LIPID MICROSPHERE
1.0000 mL | INTRAVENOUS | Status: AC | PRN
  Administered 2018-08-05: 2 mL via INTRAVENOUS
  Filled 2018-08-05: qty 10

## 2018-08-05 MED ORDER — ACETAMINOPHEN 650 MG RE SUPP: 650.0000 mg | Freq: Four times a day (QID) | RECTAL | Status: DC | PRN

## 2018-08-05 MED ORDER — AMLODIPINE BESYLATE 10 MG PO TABS
10.0000 mg | ORAL_TABLET | Freq: Every day | ORAL | Status: DC
  Administered 2018-08-05 – 2018-08-07 (×3): 10 mg via ORAL
  Filled 2018-08-05 (×3): qty 1

## 2018-08-05 MED ORDER — HEPARIN BOLUS VIA INFUSION
6000.0000 [IU] | Freq: Once | INTRAVENOUS | Status: AC
  Administered 2018-08-05: 6000 [IU] via INTRAVENOUS

## 2018-08-05 MED ORDER — INSULIN ASPART 100 UNIT/ML ~~LOC~~ SOLN
0.0000 [IU] | Freq: Three times a day (TID) | SUBCUTANEOUS | Status: DC
  Administered 2018-08-05: 5 [IU] via SUBCUTANEOUS
  Administered 2018-08-05: 3 [IU] via SUBCUTANEOUS
  Administered 2018-08-06: 11 [IU] via SUBCUTANEOUS
  Administered 2018-08-06: 3 [IU] via SUBCUTANEOUS
  Administered 2018-08-06 – 2018-08-07 (×2): 5 [IU] via SUBCUTANEOUS

## 2018-08-05 MED ORDER — OXYCODONE-ACETAMINOPHEN 5-325 MG PO TABS
1.0000 | ORAL_TABLET | Freq: Four times a day (QID) | ORAL | Status: DC | PRN
  Administered 2018-08-06: 1 via ORAL
  Filled 2018-08-05: qty 1

## 2018-08-05 MED ORDER — APIXABAN 5 MG PO TABS
10.0000 mg | ORAL_TABLET | Freq: Two times a day (BID) | ORAL | Status: DC
  Administered 2018-08-05 – 2018-08-07 (×4): 10 mg via ORAL
  Filled 2018-08-05 (×5): qty 2

## 2018-08-05 MED ORDER — PRAVASTATIN SODIUM 40 MG PO TABS
40.0000 mg | ORAL_TABLET | Freq: Every day | ORAL | Status: DC
  Administered 2018-08-05 – 2018-08-06 (×2): 40 mg via ORAL
  Filled 2018-08-05 (×2): qty 1

## 2018-08-05 MED ORDER — ONDANSETRON HCL 4 MG/2ML IJ SOLN: 4.0000 mg | Freq: Four times a day (QID) | INTRAMUSCULAR | Status: DC | PRN

## 2018-08-05 MED ORDER — SODIUM CHLORIDE 0.9% FLUSH
3.0000 mL | Freq: Two times a day (BID) | INTRAVENOUS | Status: DC
  Administered 2018-08-05 – 2018-08-07 (×5): 3 mL via INTRAVENOUS

## 2018-08-05 MED ORDER — IOPAMIDOL (ISOVUE-370) INJECTION 76%
100.0000 mL | Freq: Once | INTRAVENOUS | Status: AC | PRN
  Administered 2018-08-05: 100 mL via INTRAVENOUS

## 2018-08-05 MED ORDER — DOCUSATE SODIUM 100 MG PO CAPS
100.0000 mg | ORAL_CAPSULE | Freq: Two times a day (BID) | ORAL | Status: DC
  Administered 2018-08-05 – 2018-08-07 (×5): 100 mg via ORAL
  Filled 2018-08-05 (×5): qty 1

## 2018-08-05 MED ORDER — HEPARIN (PORCINE) 25000 UT/250ML-% IV SOLN
INTRAVENOUS | Status: AC
  Administered 2018-08-05: 6000 [IU] via INTRAVENOUS
  Filled 2018-08-05: qty 250

## 2018-08-05 MED ORDER — ONDANSETRON HCL 4 MG PO TABS: 4.0000 mg | ORAL_TABLET | Freq: Four times a day (QID) | ORAL | Status: DC | PRN

## 2018-08-05 MED ORDER — FEBUXOSTAT 40 MG PO TABS: 40.0000 mg | ORAL_TABLET | Freq: Every day | ORAL | Status: DC

## 2018-08-05 MED ORDER — APIXABAN 5 MG PO TABS: 5.0000 mg | ORAL_TABLET | Freq: Two times a day (BID) | ORAL | Status: DC

## 2018-08-05 NOTE — Progress Notes (Signed)
2D Echocardiogram has been performed.  Todd Chambers F 08/05/2018, 1:40 PM

## 2018-08-05 NOTE — Progress Notes (Signed)
ANTICOAGULATION CONSULT NOTE  Pharmacy Consult for Heparin Indication: pulmonary embolus  Allergies  Allergen Reactions  . Pioglitazone Other (See Comments)  . Statins Other (See Comments)  . Oxycodone-Acetaminophen Nausea Only  . Penicillins Rash    Did it involve swelling of the face/tongue/throat, SOB, or low BP? No Did it involve sudden or severe rash/hives, skin peeling, or any reaction on the inside of your mouth or nose? No Did you need to seek medical attention at a hospital or doctor's office? No When did it last happen?Childhood If all above answers are "NO", may proceed with cephalosporin use.    Patient Measurements: Height: 5\' 10"  (177.8 cm) Weight: (!) 309 lb 11.2 oz (140.5 kg) IBW/kg (Calculated) : 73 Heparin Dosing Weight: 110 kg  Vital Signs: Temp: 97.5 F (36.4 C) (03/14 0641) Temp Source: Oral (03/14 0641) BP: 151/88 (03/14 1242) Pulse Rate: 80 (03/14 0641)  Labs: Recent Labs    08/05/18 0221 08/05/18 1051  HGB 14.2  --   HCT 46.4  --   PLT 202  --   HEPARINUNFRC  --  0.89*  CREATININE 1.55*  --   TROPONINI <0.03  --     Estimated Creatinine Clearance: 63.6 mL/min (A) (by C-G formula based on SCr of 1.55 mg/dL (H)).   Medical History: Past Medical History:  Diagnosis Date  . Arthritis   . Diabetes mellitus without complication (HCC)   . Hypertension   . OSA (obstructive sleep apnea)   . Renal disorder   . Thyroid disease     Medications:  No current facility-administered medications on file prior to encounter.    Current Outpatient Medications on File Prior to Encounter  Medication Sig Dispense Refill  . allopurinol (ZYLOPRIM) 300 MG tablet Take 300 mg by mouth daily.    Marland Kitchen aspirin 81 MG chewable tablet Chew by mouth daily.    . Cholecalciferol (VITAMIN D PO) Take by mouth.    Marland Kitchen glipiZIDE (GLUCOTROL XL) 10 MG 24 hr tablet Take 10 mg by mouth daily with breakfast.    . labetalol (NORMODYNE) 200 MG tablet Take 200 mg by mouth 2  (two) times daily.    Marland Kitchen levothyroxine (SYNTHROID, LEVOTHROID) 100 MCG tablet Take 100 mcg by mouth daily before breakfast.    . lovastatin (MEVACOR) 40 MG tablet Take 40 mg by mouth at bedtime.    Marland Kitchen NOVOLIN 70/30 RELION (70-30) 100 UNIT/ML injection Inject 45.5-95 Units into the skin See admin instructions. Take 95 units in the morning and 45.5 units in the evening    . Omega-3 Fatty Acids (FISH OIL PO) Take by mouth.    . quinapril (ACCUPRIL) 20 MG tablet Take 20 mg by mouth daily.     . tadalafil (CIALIS) 5 MG tablet Take 5 mg by mouth daily.    Marland Kitchen tiZANidine (ZANAFLEX) 4 MG tablet Take 4 mg by mouth every 6 (six) hours as needed for muscle spasms.    Marland Kitchen torsemide (DEMADEX) 20 MG tablet Take 10 mg by mouth every Monday, Wednesday, and Friday.     . [DISCONTINUED] amLODipine (NORVASC) 10 MG tablet Take 10 mg by mouth daily.    . [DISCONTINUED] benzonatate (TESSALON) 100 MG capsule Take 1 capsule (100 mg total) by mouth every 8 (eight) hours. (Patient not taking: Reported on 08/05/2018) 15 capsule 0  . [DISCONTINUED] febuxostat (ULORIC) 40 MG tablet Take 40 mg by mouth daily.    . [DISCONTINUED] glipiZIDE (GLUCOTROL) 10 MG tablet Take 10 mg by mouth daily before  breakfast.    . [DISCONTINUED] oxyCODONE-acetaminophen (PERCOCET) 5-325 MG per tablet Take 1-2 tablets by mouth every 6 (six) hours as needed for severe pain. (Patient not taking: Reported on 08/05/2018) 10 tablet 0     Assessment: 68 y.o. male with PE on heparin. To transition to apixaban (Echo shows no heart strain).   Goal of Therapy:  Heparin level 0.3-0.7 units/ml Monitor platelets by anticoagulation protocol: Yes   Plan:  -apixaban 10mg  po bid for 7 days then 5mg  bid  -will provide patient education  Harland German, PharmD Clinical Pharmacist **Pharmacist phone directory can now be found on [email or web address] ([email or web address]).  Listed under [facility].

## 2018-08-05 NOTE — Discharge Instructions (Addendum)
Apixaban oral tablets °What is this medicine? °APIXABAN (a PIX a ban) is an anticoagulant (blood thinner). It is used to lower the chance of stroke in people with a medical condition called atrial fibrillation. It is also used to treat or prevent blood clots in the lungs or in the veins. °This medicine may be used for other purposes; ask your health care provider or pharmacist if you have questions. °COMMON BRAND NAME(S): Eliquis °What should I tell my health care provider before I take this medicine? °They need to know if you have any of these conditions: °-bleeding disorders °-bleeding in the brain °-blood in your stools (black or tarry stools) or if you have blood in your vomit °-history of stomach bleeding °-kidney disease °-liver disease °-mechanical heart valve °-an unusual or allergic reaction to apixaban, other medicines, foods, dyes, or preservatives °-pregnant or trying to get pregnant °-breast-feeding °How should I use this medicine? °Take this medicine by mouth with a glass of water. Follow the directions on the prescription label. You can take it with or without food. If it upsets your stomach, take it with food. Take your medicine at regular intervals. Do not take it more often than directed. Do not stop taking except on your doctor's advice. Stopping this medicine may increase your risk of a blood clot. Be sure to refill your prescription before you run out of medicine. °Talk to your pediatrician regarding the use of this medicine in children. Special care may be needed. °Overdosage: If you think you have taken too much of this medicine contact a poison control center or emergency room at once. °NOTE: This medicine is only for you. Do not share this medicine with others. °What if I miss a dose? °If you miss a dose, take it as soon as you can. If it is almost time for your next dose, take only that dose. Do not take double or extra doses. °What may interact with this medicine? °This medicine may  interact with the following: °-aspirin and aspirin-like medicines °-certain medicines for fungal infections like ketoconazole and itraconazole °-certain medicines for seizures like carbamazepine and phenytoin °-certain medicines that treat or prevent blood clots like warfarin, enoxaparin, and dalteparin °-clarithromycin °-NSAIDs, medicines for pain and inflammation, like ibuprofen or naproxen °-rifampin °-ritonavir °-St. John's wort °This list may not describe all possible interactions. Give your health care provider a list of all the medicines, herbs, non-prescription drugs, or dietary supplements you use. Also tell them if you smoke, drink alcohol, or use illegal drugs. Some items may interact with your medicine. °What should I watch for while using this medicine? °Visit your healthcare professional for regular checks on your progress. You may need blood work done while you are taking this medicine. Your condition will be monitored carefully while you are receiving this medicine. It is important not to miss any appointments. °Avoid sports and activities that might cause injury while you are using this medicine. Severe falls or injuries can cause unseen bleeding. Be careful when using sharp tools or knives. Consider using an electric razor. Take special care brushing or flossing your teeth. Report any injuries, bruising, or red spots on the skin to your healthcare professional. °If you are going to need surgery or other procedure, tell your healthcare professional that you are taking this medicine. °Wear a medical ID bracelet or chain. Carry a card that describes your disease and details of your medicine and dosage times. °What side effects may I notice from receiving this medicine? °Side   effects that you should report to your doctor or health care professional as soon as possible: °-allergic reactions like skin rash, itching or hives, swelling of the face, lips, or tongue °-signs and symptoms of bleeding such as  bloody or black, tarry stools; red or dark-brown urine; spitting up blood or brown material that looks like coffee grounds; red spots on the skin; unusual bruising or bleeding from the eye, gums, or nose °-signs and symptoms of a blood clot such as chest pain; shortness of breath; pain, swelling, or warmth in the leg °-signs and symptoms of a stroke such as changes in vision; confusion; trouble speaking or understanding; severe headaches; sudden numbness or weakness of the face, arm or leg; trouble walking; dizziness; loss of coordination °This list may not describe all possible side effects. Call your doctor for medical advice about side effects. You may report side effects to FDA at 1-800-FDA-1088. °Where should I keep my medicine? °Keep out of the reach of children. °Store at room temperature between 20 and 25 degrees C (68 and 77 degrees F). Throw away any unused medicine after the expiration date. °NOTE: This sheet is a summary. It may not cover all possible information. If you have questions about this medicine, talk to your doctor, pharmacist, or health care provider. °© 2019 [employer] (2017-05-05 11:20:07) ° °

## 2018-08-05 NOTE — H&P (Signed)
History and Physical    Todd KoyanagiJames Span WUJ:[id number] DOB: 05/22/1951 DOA: 08/05/2018  PCP: Clide DalesWright, Morgan Dionne, PA Consultants:  Rayna SextonIgwemezie - nephrology; Eskew - urology Patient coming from:  Home - lives with wife; NOK: Wife, daughter, [phone]  Chief Complaint: SOB  HPI: Todd Chambers is a 68 y.o. male with medical history significant of hypothyroidism; stage 3 CKD; OSA; HTN; morbid obesity (BMI 44); and DM presenting with SOB. Patient got very SOB.  Symptoms started about 2 weeks.  It started intermittent but progressed to constant last week.  No cough.  He felt like he was congested and would use cough drops.  He felt like he was panicking and couldn't catch his breath.  He has a remote h/o PNA and last year he was given an inhaler; he used that without relief.   Finally, his daughter came to check on him and she made him go to the ER.  His left hand was swollen one day and both legs looked a little swollen. He has had cramping a lot too.  He is immobile due to arthritis.  No recent surgeries or broken bones.  No recent travel.    ED Course:  [facility] as per Dr. Antionette Charpyd:  [facility]Dr Manus Gunningancour). 68 yom with hx of HTN, T2DM, and CHF presents with SOB and leg swelling bilaterally. Acute CHF suspected initially and 40 mg IV Lasix given with good diuresis but no improvement in SOB. D-dimer elevated and CTA positive for acute PE without heart strain, and with no other acute pulmonary disease. Troponin and BNP are normal but he has new supplemental O2 of 2-3 Lpm. Unclear precipitant.   Review of Systems: As per HPI; otherwise review of systems reviewed and negative.   Ambulatory Status:  Ambulates with a cane but mostly unable to walk far  Past Medical History:  Diagnosis Date  . Arthritis   . Diabetes mellitus without complication (HCC)   . Hypertension   . OSA (obstructive sleep apnea)   . Renal disorder   . Thyroid disease     Past Surgical History:  Procedure  Laterality Date  . APPENDECTOMY    . CHOLECYSTECTOMY    . SALIVARY GLAND SURGERY      Social History   Socioeconomic History  . Marital status: Married    Spouse name: Not on file  . Number of children: Not on file  . Years of education: Not on file  . Highest education level: Not on file  Occupational History  . Occupation: retired  Engineer, productionocial Needs  . Financial resource strain: Not on file  . Food insecurity:    Worry: Not on file    Inability: Not on file  . Transportation needs:    Medical: Not on file    Non-medical: Not on file  Tobacco Use  . Smoking status: Never Smoker  . Smokeless tobacco: Former Engineer, waterUser  Substance and Sexual Activity  . Alcohol use: No  . Drug use: No  . Sexual activity: Not on file  Lifestyle  . Physical activity:    Days per week: Not on file    Minutes per session: Not on file  . Stress: Not on file  Relationships  . Social connections:    Talks on phone: Not on file    Gets together: Not on file    Attends religious service: Not on file    Active member of club or organization: Not on file    Attends  meetings of clubs or organizations: Not on file    Relationship status: Not on file  . Intimate partner violence:    Fear of current or ex partner: Not on file    Emotionally abused: Not on file    Physically abused: Not on file    Forced sexual activity: Not on file  Other Topics Concern  . Not on file  Social History Narrative  . Not on file    Allergies  Allergen Reactions  . Penicillins     History reviewed. No pertinent family history.  Prior to Admission medications   Medication Sig Start Date End Date Taking? Authorizing Provider  amLODipine (NORVASC) 10 MG tablet Take 10 mg by mouth daily.    [provider]  aspirin 81 MG chewable tablet Chew by mouth daily.    [provider]  benzonatate (TESSALON) 100 MG capsule Take 1 capsule (100 mg total) by mouth every 8 (eight) hours. 07/06/17   Renne Crigler,  PA-C  Cholecalciferol (VITAMIN D PO) Take by mouth.    [provider]  febuxostat (ULORIC) 40 MG tablet Take 40 mg by mouth daily.    [provider]  glipiZIDE (GLUCOTROL) 10 MG tablet Take 10 mg by mouth daily before breakfast.    [provider]  levothyroxine (SYNTHROID, LEVOTHROID) 100 MCG tablet Take 100 mcg by mouth daily before breakfast.    [provider]  lovastatin (MEVACOR) 40 MG tablet Take 40 mg by mouth at bedtime.    [provider]  Omega-3 Fatty Acids (FISH OIL PO) Take by mouth.    [provider]  oxyCODONE-acetaminophen (PERCOCET) 5-325 MG per tablet Take 1-2 tablets by mouth every 6 (six) hours as needed for severe pain. 01/16/14   Hess, Nada Boozer, PA-C  quinapril (ACCUPRIL) 10 MG tablet Take 10 mg by mouth daily.    [provider]  torsemide (DEMADEX) 10 MG tablet Take 10 mg by mouth daily.    [provider]    Physical Exam: Vitals:   08/05/18 0432 08/05/18 0530 08/05/18 0641 08/05/18 0700  BP: (!) 148/95 (!) 147/86 (!) 167/104   Pulse: 81 76 80   Resp: (!) 24  19   Temp:   (!) 97.5 F (36.4 C)   TempSrc:   Oral   SpO2: 98% 98% 100%   Weight:    (!) 140.5 kg  Height:    5\' 10"  (1.778 m)     . General:  Appears calm and comfortable and is NAD . Eyes:   EOMI, normal lids, iris . ENT:  grossly normal hearing, lips & tongue, mmm . Neck:  no LAD, masses or thyromegaly . Cardiovascular:  RRR, no m/r/g. No LE edema.  Marland Kitchen Respiratory:   CTA bilaterally with no wheezes/rales/rhonchi.  Normal respiratory effort.  On [place] O2 . Abdomen:  soft, NT, ND, NABS . Skin:  no rash or induration seen on limited exam . Musculoskeletal:  grossly normal tone BUE/BLE, good ROM, no bony abnormality.  The R calf was slightly more full without erythema or TTP.  Negative Homan's/squeeze. Marland Kitchen Psychiatric:  grossly normal mood and affect, speech fluent and appropriate, AOx3 . Neurologic:  CN 2-12 grossly intact, moves  all extremities in coordinated fashion, sensation intact    Radiological Exams on Admission: Dg Chest 2 View  Result Date: 08/05/2018 CLINICAL DATA:  Dyspnea worse with exertion. EXAM: CHEST - 2 VIEW COMPARISON:  08/03/2018 FINDINGS: Stable cardiomegaly with aortic atherosclerosis. Mild central vascular  congestion is noted left basilar atelectasis is present. No acute nor suspicious osseous abnormalities. Degenerative change noted about both shoulders and dorsal spine. IMPRESSION: Cardiomegaly with central vascular congestion. Electronically Signed   By: Tollie Eth M.D.   On: 08/05/2018 02:58   Ct Angio Chest Pe W And/or Wo Contrast  Result Date: 08/05/2018 CLINICAL DATA:  Dyspnea x2 weeks with exertion.  Positive D-dimer. EXAM: CT ANGIOGRAPHY CHEST WITH CONTRAST TECHNIQUE: Multidetector CT imaging of the chest was performed using the standard protocol during bolus administration of intravenous contrast. Multiplanar CT image reconstructions and MIPs were obtained to evaluate the vascular anatomy. CONTRAST:  ISOVUE-370 IOPAMIDOL (ISOVUE-370) INJECTION 76% COMPARISON:  None. FINDINGS: Cardiovascular: Conventional branch pattern of the great vessels with mild atherosclerosis the origin of the left subclavian artery. Ectatic thoracic aorta without aneurysm or dissection. Satisfactory pulmonary arterial opacification with tiny nonocclusive bandlike pulmonary emboli to the right lower lobe. No right heart strain. Cardiomegaly without pericardial effusion or thickening. Mediastinum/Nodes: Small hilar nodes measuring up to 1 cm. No mediastinal, axillary nor supraclavicular adenopathy. Patent trachea and mainstem bronchi. Esophagus is unremarkable. Lungs/Pleura: Scarring atelectasis at the right lung apex and upper lobe. Dependent atelectasis is otherwise noted at each lung base with subsegmental atelectasis the periphery of lingula. No or pneumothorax. Upper Abdomen: No acute abnormality. 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 Review of the MIP images confirms the above findings. IMPRESSION: 1. Nonocclusive pulmonary emboli to the right lobe without heart strain. These results were called by telephone at the time of interpretation on 08/05/2018 at 3:44 am to Dr. Glynn Octave , who verbally acknowledged these results. 2. No active pulmonary disease.  Subsegmental atelectasis as above. Electronically Signed   By: Tollie Eth M.D.   On: 08/05/2018 03:44    EKG: Independently reviewed.  NSR with rate 85; nonspecific ST changes with no evidence of acute ischemia   Labs on Admission: I have personally reviewed the available labs and imaging studies at the time of the admission.  Pertinent labs:   Glucose 174 BUN 15/Creatinine 1.55/GFR 45 BNP 39.4 Troponin <0.03 Unremarkable CBC D-dimer 1.36   Assessment/Plan Principal Problem:   Acute pulmonary embolism (HCC) Active Problems:   Diabetes mellitus type 2 in obese [facility])   Essential hypertension   CKD (chronic kidney disease) stage 3, GFR 30-59 ml/min (HCC)   Obesity, Class III, BMI 40-49.9 (morbid obesity) (HCC)   PE -Patient without prior episodes of thromboembolic disease presenting with new PE -He has had about 2 weeks of progressive SOB -Will observe on telemetry -Initiate anticoagulation - he was started on Heparin, but will transition to Eliquis ($45 copay, as per CM)  -Coumadin is another option, since it is very inexpensive; however, it requires frequent physician visits and he is pretty immobile and so this is not a viable option for him -His most likely risk factor is his general immobility associated with his weight in conjunction with OA -DVT US ordered; while his legs are generally normal in appearance and non-painful, if he does have an underlying DVT then an IVC filter might need to be considered - particularly if he is unable to tolerate AC. -I discussed at length with the patient  the risks/benefits of anticoagulation and why this was the treatment plan chosen and he is in agreement with this plan. -Will order Echo to r/o right heart strain prior to initiation of Eliquis, but low suspicion for this based on the fact that it was not  seen on CT, EKG normal, etc.  DM -A1c in 10/19 was 8.5 -Hold Glucotrol -Cover with moderate-scale SSI   HTN -Continue Norvasc -Hold Accupril based on renal dysfunction for now  CKD stage 3 -Appears to be relatively stable at this time -Hold ACE  -Recheck BMP in the AM  Obesity -He should be encouraged to increase mobility, although this is likely to be a challenge  Hypothyroidism -Normal TSH in 10/19 -Continue Synthroid at current dose for now   DVT prophylaxis:  Heparin -> Eliquis Code Status:  Full - confirmed with patient Family Communication: None present Disposition Plan:  Home once clinically improved Consults called: None  Admission status: It is my clinical opinion that referral for OBSERVATION is reasonable and necessary in this patient based on the above information provided. The aforementioned taken together are felt to place the patient at high risk for further clinical deterioration. However it is anticipated that the patient may be medically stable for discharge from the hospital within 24 to 48 hours.    Jonah Blue MD [facility]   How to contact the [facility] Attending or Consulting provider 7A - 7P or covering provider during after hours 7P -7A, for this patient?  1. Check the care team in [facility] and look for a) attending/consulting [facility] provider listed and b) the [facility] team listed 2. Log into [email or web address] and use [place]'s universal password to access. If you do not have the password, please contact the hospital operator. 3. Locate the [facility] provider you are looking for under [facility] and page to a number that you can be directly reached. 4. If you still have difficulty reaching the provider,  please page the [facility] (Director on Call) for the Hospitalists listed on amion for assistance.   08/05/2018, 9:51 AM

## 2018-08-05 NOTE — Progress Notes (Signed)
ANTICOAGULATION CONSULT NOTE  Pharmacy Consult for Heparin Indication: pulmonary embolus  Allergies  Allergen Reactions  . Pioglitazone Other (See Comments)  . Statins Other (See Comments)  . Oxycodone-Acetaminophen Nausea Only  . Penicillins Rash    Did it involve swelling of the face/tongue/throat, SOB, or low BP? No Did it involve sudden or severe rash/hives, skin peeling, or any reaction on the inside of your mouth or nose? No Did you need to seek medical attention at a hospital or doctor's office? No When did it last happen?Childhood If all above answers are "NO", may proceed with cephalosporin use.    Patient Measurements: Height: 5\' 10"  (177.8 cm) Weight: (!) 309 lb 11.2 oz (140.5 kg) IBW/kg (Calculated) : 73 Heparin Dosing Weight: 110 kg  Vital Signs: Temp: 97.5 F (36.4 C) (03/14 0641) Temp Source: Oral (03/14 0641) BP: 167/104 (03/14 0641) Pulse Rate: 80 (03/14 0641)  Labs: Recent Labs    08/05/18 0221 08/05/18 1051  HGB 14.2  --   HCT 46.4  --   PLT 202  --   HEPARINUNFRC  --  0.89*  CREATININE 1.55*  --   TROPONINI <0.03  --     Estimated Creatinine Clearance: 63.6 mL/min (A) (by C-G formula based on SCr of 1.55 mg/dL (H)).   Medical History: Past Medical History:  Diagnosis Date  . Arthritis   . Diabetes mellitus without complication (HCC)   . Hypertension   . OSA (obstructive sleep apnea)   . Renal disorder   . Thyroid disease     Medications:  No current facility-administered medications on file prior to encounter.    Current Outpatient Medications on File Prior to Encounter  Medication Sig Dispense Refill  . allopurinol (ZYLOPRIM) 300 MG tablet Take 300 mg by mouth daily.    Marland Kitchen aspirin 81 MG chewable tablet Chew by mouth daily.    . Cholecalciferol (VITAMIN D PO) Take by mouth.    Marland Kitchen glipiZIDE (GLUCOTROL XL) 10 MG 24 hr tablet Take 10 mg by mouth daily with breakfast.    . labetalol (NORMODYNE) 200 MG tablet Take 200 mg by mouth 2  (two) times daily.    Marland Kitchen levothyroxine (SYNTHROID, LEVOTHROID) 100 MCG tablet Take 100 mcg by mouth daily before breakfast.    . lovastatin (MEVACOR) 40 MG tablet Take 40 mg by mouth at bedtime.    Marland Kitchen NOVOLIN 70/30 RELION (70-30) 100 UNIT/ML injection Inject 45.5-95 Units into the skin See admin instructions. Take 95 units in the morning and 45.5 units in the evening    . Omega-3 Fatty Acids (FISH OIL PO) Take by mouth.    . quinapril (ACCUPRIL) 20 MG tablet Take 20 mg by mouth daily.     . tadalafil (CIALIS) 5 MG tablet Take 5 mg by mouth daily.    Marland Kitchen tiZANidine (ZANAFLEX) 4 MG tablet Take 4 mg by mouth every 6 (six) hours as needed for muscle spasms.    Marland Kitchen torsemide (DEMADEX) 20 MG tablet Take 10 mg by mouth every Monday, Wednesday, and Friday.     . [DISCONTINUED] amLODipine (NORVASC) 10 MG tablet Take 10 mg by mouth daily.    . [DISCONTINUED] benzonatate (TESSALON) 100 MG capsule Take 1 capsule (100 mg total) by mouth every 8 (eight) hours. (Patient not taking: Reported on 08/05/2018) 15 capsule 0  . [DISCONTINUED] febuxostat (ULORIC) 40 MG tablet Take 40 mg by mouth daily.    . [DISCONTINUED] glipiZIDE (GLUCOTROL) 10 MG tablet Take 10 mg by mouth daily before  breakfast.    . [DISCONTINUED] oxyCODONE-acetaminophen (PERCOCET) 5-325 MG per tablet Take 1-2 tablets by mouth every 6 (six) hours as needed for severe pain. (Patient not taking: Reported on 08/05/2018) 10 tablet 0     Assessment: 68 y.o. male with PE on heparin. To transition to apixaban today  if Echo shows no heart strain  -CBC stable -Heparin level= 0.89  Goal of Therapy:  Heparin level 0.3-0.7 units/ml Monitor platelets by anticoagulation protocol: Yes   Plan:  -Decrease heparin to 1500 units/hr -Heparin level in 8 hours and daily wth CBC daily  Harland German, PharmD Clinical Pharmacist **Pharmacist phone directory can now be found on [email or web address] ([email or web address]).  Listed under [facility].

## 2018-08-05 NOTE — ED Triage Notes (Signed)
Pt reports shob, worsens with exertion. Pt had negative chest xray x 2 days ago.

## 2018-08-05 NOTE — ED Notes (Signed)
[facility] here for transport. 

## 2018-08-05 NOTE — Care Management Obs Status (Signed)
MEDICARE OBSERVATION STATUS NOTIFICATION   Patient Details  Name: Todd Chambers MRN: [id number] Date of Birth: 03/18/1951   Medicare Observation Status Notification Given:  Yes    Cherrie Distance, RN 08/05/2018, 12:56 PM

## 2018-08-05 NOTE — ED Provider Notes (Signed)
[facility] EMERGENCY DEPARTMENT Provider Note   CSN: [id number] Arrival date & time: 08/05/18  0159    History   Chief Complaint Chief Complaint  Patient presents with  . Shortness of Breath    HPI Todd Chambers is a 68 y.o. male.     Patient with history of morbid obesity, sleep apnea, noncompliance with CPAP, diabetes and hypertension presenting with 2-day history of progressively worsening shortness of breath that is worse with exertion.  States he first noticed it 2 days ago when he was at his nephrologist office and had a chest x-ray which he does not know the results of.  States he has not had any cough or fever.  The shortness of breath is worse when he tries to exert himself.  He is noticed some swelling in his legs but no chest pain.  Denies any history of asthma, CHF or COPD.  He does not smoke.  He is never had a breathing problem like this in the past.  He became in tonight because he states he got scared that something was going on with his heart.  The history is provided by the patient.  Shortness of Breath  Associated symptoms: no abdominal pain, no chest pain, no cough, no headaches, no rash and no vomiting     Past Medical History:  Diagnosis Date  . Arthritis   . Diabetes mellitus without complication (HCC)   . Hypertension   . OSA (obstructive sleep apnea)   . Renal disorder   . Thyroid disease     There are no active problems to display for this patient.   Past Surgical History:  Procedure Laterality Date  . CHOLECYSTECTOMY          Home Medications    Prior to Admission medications   Medication Sig Start Date End Date Taking? Authorizing Provider  amLODipine (NORVASC) 10 MG tablet Take 10 mg by mouth daily.    [provider]  aspirin 81 MG chewable tablet Chew by mouth daily.    [provider]  benzonatate (TESSALON) 100 MG capsule Take 1 capsule (100 mg total) by mouth every 8 (eight) hours. 07/06/17   Renne Crigler, PA-C  Cholecalciferol (VITAMIN D PO) Take by mouth.    [provider]  febuxostat (ULORIC) 40 MG tablet Take 40 mg by mouth daily.    [provider]  glipiZIDE (GLUCOTROL) 10 MG tablet Take 10 mg by mouth daily before breakfast.    [provider]  levothyroxine (SYNTHROID, LEVOTHROID) 100 MCG tablet Take 100 mcg by mouth daily before breakfast.    [provider]  lovastatin (MEVACOR) 40 MG tablet Take 40 mg by mouth at bedtime.    [provider]  Omega-3 Fatty Acids (FISH OIL PO) Take by mouth.    [provider]  oxyCODONE-acetaminophen (PERCOCET) 5-325 MG per tablet Take 1-2 tablets by mouth every 6 (six) hours as needed for severe pain. 01/16/14   Hess, Nada Boozer, PA-C  quinapril (ACCUPRIL) 10 MG tablet Take 10 mg by mouth daily.    [provider]  torsemide (DEMADEX) 10 MG tablet Take 10 mg by mouth daily.    [provider]    Family History No family history on file.  Social History Social History   Tobacco Use  . Smoking status: Never Smoker  . Smokeless tobacco: Former Engineer, water Use Topics  . Alcohol use: No  . Drug use: No     Allergies  Penicillins   Review of Systems Review of Systems  Constitutional: Positive for activity change. Negative for fatigue.  HENT: Negative for congestion and rhinorrhea.   Eyes: Negative for visual disturbance.  Respiratory: Positive for shortness of breath. Negative for cough and chest tightness.   Cardiovascular: Positive for leg swelling. Negative for chest pain.  Gastrointestinal: Negative for abdominal pain, nausea and vomiting.  Genitourinary: Negative for dysuria.  Musculoskeletal: Negative for arthralgias and myalgias.  Skin: Negative for rash.  Neurological: Negative for dizziness, weakness and headaches.    all other systems are negative except as noted in the HPI and PMH.   Physical Exam Updated Vital Signs BP (!) 163/90   Pulse  89   Temp 98.1 F (36.7 C) (Oral)   Resp (!) 24   Ht 5\' 10"  (1.778 m)   Wt (!) 145.2 kg   SpO2 90%   BMI 45.92 kg/m   Physical Exam Vitals signs and nursing note reviewed.  Constitutional:      General: He is in acute distress.     Appearance: He is well-developed. He is obese.     Comments: Mildly increased work of breathing with dyspnea with conversation  HENT:     Head: Normocephalic and atraumatic.     Mouth/Throat:     Pharynx: No oropharyngeal exudate.  Eyes:     Conjunctiva/sclera: Conjunctivae normal.     Pupils: Pupils are equal, round, and reactive to light.  Neck:     Musculoskeletal: Normal range of motion and neck supple.     Comments: No meningismus. Cardiovascular:     Rate and Rhythm: Normal rate and regular rhythm.     Heart sounds: Normal heart sounds. No murmur.  Pulmonary:     Effort: Pulmonary effort is normal. No respiratory distress.     Breath sounds: Rales present.     Comments: Crackles at bases Abdominal:     Palpations: Abdomen is soft.     Tenderness: There is no abdominal tenderness. There is no guarding or rebound.  Musculoskeletal: Normal range of motion.        General: No tenderness.     Right lower leg: Edema present.     Left lower leg: Edema present.     Comments: +2 edema to knees bilaterally  Skin:    General: Skin is warm.     Capillary Refill: Capillary refill takes less than 2 seconds.  Neurological:     General: No focal deficit present.     Mental Status: He is alert and oriented to person, place, and time. Mental status is at baseline.     Cranial Nerves: No cranial nerve deficit.     Motor: No abnormal muscle tone.     Coordination: Coordination normal.     Comments: No ataxia on finger to nose bilaterally. No pronator drift. 5/5 strength throughout. CN 2-12 intact.Equal grip strength. Sensation intact.   Psychiatric:        Behavior: Behavior normal.      ED Treatments / Results  Labs (all labs ordered are  listed, but only abnormal results are displayed) Labs Reviewed  CBC WITH DIFFERENTIAL/PLATELET - Abnormal; Notable for the following components:      Result Value   MCH 24.8 (*)    RDW 17.9 (*)    All other components within normal limits  BASIC METABOLIC PANEL - Abnormal; Notable for the following components:   Glucose, Bld 174 (*)    Creatinine, Ser 1.55 (*)  GFR calc non Af Amer 45 (*)    GFR calc Af Amer 52 (*)    All other components within normal limits  D-DIMER, QUANTITATIVE (NOT AT [facility] [place]) - Abnormal; Notable for the following components:   D-Dimer, Quant 1.36 (*)    All other components within normal limits  TROPONIN I  BRAIN NATRIURETIC PEPTIDE    EKG EKG Interpretation  Date/Time:  Saturday August 05 2018 02:10:25 EDT Ventricular Rate:  85 PR Interval:    QRS Duration: 97 QT Interval:  390 QTC Calculation: 464 R Axis:   28 Text Interpretation:  Sinus rhythm Low voltage, precordial leads Baseline wander in lead(s) V2 No previous ECGs available Confirmed by Glynn Octave [phone]) on 08/05/2018 2:22:09 AM   Radiology Dg Chest 2 View  Result Date: 08/05/2018 CLINICAL DATA:  Dyspnea worse with exertion. EXAM: CHEST - 2 VIEW COMPARISON:  08/03/2018 FINDINGS: Stable cardiomegaly with aortic atherosclerosis. Mild central vascular congestion is noted left basilar atelectasis is present. No acute nor suspicious osseous abnormalities. Degenerative change noted about both shoulders and dorsal spine. IMPRESSION: Cardiomegaly with central vascular congestion. Electronically Signed   By: Tollie Eth M.D.   On: 08/05/2018 02:58   Ct Angio Chest Pe W And/or Wo Contrast  Result Date: 08/05/2018 CLINICAL DATA:  Dyspnea x2 weeks with exertion.  Positive D-dimer. EXAM: CT ANGIOGRAPHY CHEST WITH CONTRAST TECHNIQUE: Multidetector CT imaging of the chest was performed using the standard protocol during bolus administration of intravenous contrast. Multiplanar CT image reconstructions and  MIPs were obtained to evaluate the vascular anatomy. CONTRAST:  ISOVUE-370 IOPAMIDOL (ISOVUE-370) INJECTION 76% COMPARISON:  None. FINDINGS: Cardiovascular: Conventional branch pattern of the great vessels with mild atherosclerosis the origin of the left subclavian artery. Ectatic thoracic aorta without aneurysm or dissection. Satisfactory pulmonary arterial opacification with tiny nonocclusive bandlike pulmonary emboli to the right lower lobe. No right heart strain. Cardiomegaly without pericardial effusion or thickening. Mediastinum/Nodes: Small hilar nodes measuring up to 1 cm. No mediastinal, axillary nor supraclavicular adenopathy. Patent trachea and mainstem bronchi. Esophagus is unremarkable. Lungs/Pleura: Scarring atelectasis at the right lung apex and upper lobe. Dependent atelectasis is otherwise noted at each lung base with subsegmental atelectasis the periphery of lingula. No or pneumothorax. Upper Abdomen: No acute abnormality. 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 Review of the MIP images confirms the above findings. IMPRESSION: 1. Nonocclusive pulmonary emboli to the right lobe without heart strain. These results were called by telephone at the time of interpretation on 08/05/2018 at 3:44 am to Dr. Glynn Octave , who verbally acknowledged these results. 2. No active pulmonary disease.  Subsegmental atelectasis as above. Electronically Signed   By: Tollie Eth M.D.   On: 08/05/2018 03:44    Procedures Procedures (including critical care time)  Medications Ordered in ED Medications  furosemide (LASIX) injection 40 mg (40 mg Intravenous Given 08/05/18 0229)     Initial Impression / Assessment and Plan / ED Course  I have reviewed the triage vital signs and the nursing notes.  Pertinent labs & imaging results that were available during my care of the patient were reviewed by me and considered in my medical decision making (see chart for  details).       Shortness of breath for several days and progressively worsening.  No chest pain, cough or fever.  EKG without acute ST changes.  Concern for CHF exacerbation.  Patient does take torsemide at home and states compliance but is never been diagnosed with  CHF.  Does have what sounds like 3 pillow orthopnea at baseline. Chest x-ray from 2 days ago was negative by report.  Hypoxic to 84% on arrival. Placed on nasal cannula.   IV Lasix given.  Chest x-ray does show interstitial edema with cardiomegaly. D-dimer mildly elevated.  CT scan shows nonocclusive right lower lobe thrombus without right heart strain.  Will start heparin.  Patient reports no history of blood clot in the past.  Patient with new oxygen requirement in setting of new pulmonary embolism and CHF.  Will need admission.  [facility].  D/w Dr. Lucianne Muss. No beds at [facility] until after 11am.  Will d/w [facility].  D/w Dr. Antionette Char who accepts patient to [facility] telemetry bed.    CRITICAL CARE Performed by: Glynn Octave Total critical care time: 35 minutes Critical care time was exclusive of separately billable procedures and treating other patients. Critical care was necessary to treat or prevent imminent or life-threatening deterioration. Critical care was time spent personally by me on the following activities: development of treatment plan with patient and/or surrogate as well as nursing, discussions with consultants, evaluation of patient's response to treatment, examination of patient, obtaining history from patient or surrogate, ordering and performing treatments and interventions, ordering and review of laboratory studies, ordering and review of radiographic studies, pulse oximetry and re-evaluation of patient's condition.    Final Clinical Impressions(s) / ED Diagnoses   Final diagnoses:  Acute respiratory failure with hypoxia (HCC)  Single subsegmental pulmonary embolism without acute cor  pulmonale  Acute on chronic congestive heart failure, unspecified heart failure type [facility])    ED Discharge Orders    None       Glynn Octave, MD 08/05/18 0451

## 2018-08-05 NOTE — TOC Initial Note (Addendum)
Transition of Care [facility]) - Initial/Assessment Note    Patient Details  Name: Todd Chambers MRN: [id number] Date of Birth: 09-18-1950  Transition of [facility]) CM/SW Contact:    Cherrie Distance, RN,MHA,BSN Phone Number:[phone] 08/05/2018, 9:16 AM  Clinical Narrative:                 CM talked to patient at the bedside, introduced my role as CM; Patient lives at home with spouse; PCP is [facility]; has private insurance with [employer] with prescription drug coverage; pharmacy of choice is [facility] [address], [place]; patient needs NOAC; patient's insurance company is closed on the weekend; [facility] called, spoke to [employer]- they requested a script to be faxed to them; CM informed them that MD was not sure which NOAC to place the patient on at this time and is requesting a co pay check; Denny Peon stated that she would try to run it and call CM back, hopefully within the hour.CM will continue to follow for progression of care.  9:45 am - Received call from [employer], co pay is $45. Attending MD made aware. Abelino Derrick RN,MHA,BSN  Expected Discharge Plan: Home/Self Care Barriers to Discharge: No Barriers Identified   Patient Goals and CMS Choice Patient states their goals for this hospitalization and ongoing recovery are:: to stay at home CMS Medicare.gov Compare Post Acute Care list provided to:: Patient Choice offered to / list presented to : NA  Expected Discharge Plan and Services Expected Discharge Plan: Home/Self Care Discharge Planning Services: NA   Living arrangements for the past 2 months: Single Family Home                 DME Arranged: N/A   HH Arranged: NA HH Agency: NA  Prior Living Arrangements/Services Living arrangements for the past 2 months: Single Family Home Lives with:: Spouse Patient language and need for interpreter reviewed:: No Do you feel safe going back to the place where you live?: Yes      Need for Family  Participation in Patient Care: No (Comment) Care giver support system in place?: Yes (comment)   Criminal Activity/Legal Involvement Pertinent to Current Situation/Hospitalization: No - Comment as needed  Activities of Daily Living    Independent  Permission Sought/Granted Permission sought to share information with : Case Manager Permission granted to share information with : Yes, Verbal Permission Granted  Share Information with NAME: Lenase Tanguma (Spouse)  Permission granted to share info w AGENCY: [facility]        Emotional Assessment Appearance:: Well-Groomed Attitude/Demeanor/Rapport: Gracious Affect (typically observed): Accepting Orientation: : Oriented to Self, Oriented to  Time, Oriented to Place, Oriented to Situation Alcohol / Substance Use: Not Applicable Psych Involvement: No (comment)  Admission diagnosis:  Acute respiratory failure with hypoxia (HCC) [J96.01] Acute on chronic congestive heart failure, unspecified heart failure type (HCC) [I50.9] Single subsegmental pulmonary embolism without acute cor pulmonale [I26.93] Patient Active Problem List   Diagnosis Date Noted  . Acute pulmonary embolism (HCC) 08/05/2018   PCP:  Clide Dales, PA Pharmacy:  No Pharmacies Listed    Social Determinants of Health (SDOH) Interventions    Readmission Risk Interventions  No flowsheet data found.

## 2018-08-05 NOTE — Progress Notes (Signed)
Placed patient on 2 liter nasal cannula due to his SPO2 being 84% on room air..  Patient's SPO2 increased to 96% on 2 liter nasal cannula.

## 2018-08-05 NOTE — ED Notes (Signed)
ED TO INPATIENT HANDOFF REPORT  ED Nurse Name and Phone #: Caryl Asp [phone]  S Name/Age/Gender Todd Chambers 68 y.o. male Room/Bed: MH02/MH02  Code Status   Code Status: Not on file  Home/SNF/Other Admit A/O x 4 Is this baseline? Yes   Triage Complete: Triage complete  Chief Complaint shortness of breath  Triage Note Pt reports shob, worsens with exertion. Pt had negative chest xray x 2 days ago.   Pt initially 84% pulse ox on RA, RT at bedside. Pt placed on oxygen [place] @ 2L.    Allergies Allergies  Allergen Reactions  . Penicillins     Level of Care/Admitting Diagnosis ED Disposition    ED Disposition Condition Comment   Admit  Chambers Area: [facility] [[id number]]  Level of Care: Telemetry Cardiac [103]  I expect the patient will be discharged within 24 hours: Yes  LOW acuity---Tx typically complete <24 hrs---ACUTE conditions typically can be evaluated <24 hours---LABS likely to return to acceptable levels <24 hours---IS near functional baseline---EXPECTED to return to current living arrangement---NOT newly hypoxic: Does not meet criteria for 5C-Observation unit  Diagnosis: Acute pulmonary embolism [facility]) [[id number]]  Admitting Physician: Briscoe Deutscher [[id number]]  Attending Physician: Briscoe Deutscher [[id number]]  PT Class (Do Not Modify): Observation [104]  PT Acc Code (Do Not Modify): Observation [10022]       B Medical/Surgery History Past Medical History:  Diagnosis Date  . Arthritis   . Diabetes mellitus without complication (HCC)   . Hypertension   . OSA (obstructive sleep apnea)   . Renal disorder   . Thyroid disease    Past Surgical History:  Procedure Laterality Date  . CHOLECYSTECTOMY       A IV Location/Drains/Wounds Patient Lines/Drains/Airways Status   Active Line/Drains/Airways    Name:   Placement date:   Placement time:   Site:   Days:   Peripheral IV 08/05/18 Right Antecubital   08/05/18    0215    Antecubital   less than 1           Intake/Output Last 24 hours  Intake/Output Summary (Last 24 hours) at 08/05/2018 0519 Last data filed at 08/05/2018 0400 Gross per 24 hour  Intake -  Output 1100 ml  Net -1100 ml    Labs/Imaging Results for orders placed or performed during the Chambers encounter of 08/05/18 (from the past 48 hour(s))  CBC with Differential/Platelet     Status: Abnormal   Collection Time: 08/05/18  2:21 AM  Result Value Ref Range   WBC 6.9 4.0 - 10.5 K/uL   RBC 5.73 4.22 - 5.81 MIL/uL   Hemoglobin 14.2 13.0 - 17.0 g/dL   HCT 40.3 70.9 - 64.3 %   MCV 81.0 80.0 - 100.0 fL   MCH 24.8 (L) 26.0 - 34.0 pg   MCHC 30.6 30.0 - 36.0 g/dL   RDW 83.8 (H) 18.4 - 03.7 %   Platelets 202 150 - 400 K/uL   nRBC 0.0 0.0 - 0.2 %   Neutrophils Relative % 52 %   Neutro Abs 3.6 1.7 - 7.7 K/uL   Lymphocytes Relative 34 %   Lymphs Abs 2.4 0.7 - 4.0 K/uL   Monocytes Relative 11 %   Monocytes Absolute 0.8 0.1 - 1.0 K/uL   Eosinophils Relative 2 %   Eosinophils Absolute 0.1 0.0 - 0.5 K/uL   Basophils Relative 1 %   Basophils Absolute 0.1 0.0 - 0.1 K/uL   Immature Granulocytes 0 %  Abs Immature Granulocytes 0.02 0.00 - 0.07 K/uL    Comment: Performed at [facility], [address] [place], [place] [address]  Basic metabolic panel     Status: Abnormal   Collection Time: 08/05/18  2:21 AM  Result Value Ref Range   Sodium 138 135 - 145 mmol/L   Potassium 3.8 3.5 - 5.1 mmol/L   Chloride 101 98 - 111 mmol/L   CO2 31 22 - 32 mmol/L   Glucose, Bld 174 (H) 70 - 99 mg/dL   BUN 15 8 - 23 mg/dL   Creatinine, Ser 8.47 (H) 0.61 - 1.24 mg/dL   Calcium 9.0 8.9 - 20.7 mg/dL   GFR calc non Af Amer 45 (L) >60 mL/min   GFR calc Af Amer 52 (L) >60 mL/min   Anion gap 6 5 - 15    Comment: Performed at [facility], [address] [place], [place] [address]  Troponin I - ONCE - STAT     Status: None   Collection Time: 08/05/18  2:21 AM  Result Value Ref Range   Troponin I <0.03 <0.03 ng/mL     Comment: Performed at [facility], [address] [place], [place] [address]  Brain natriuretic peptide     Status: None   Collection Time: 08/05/18  2:21 AM  Result Value Ref Range   B Natriuretic Peptide 39.4 0.0 - 100.0 pg/mL    Comment: Performed at [facility], [address] [place], [place] [address]  D-dimer, quantitative (not at [facility])     Status: Abnormal   Collection Time: 08/05/18  2:21 AM  Result Value Ref Range   D-Dimer, Quant 1.36 (H) 0.00 - 0.50 ug/mL-FEU    Comment: (NOTE) At the manufacturer cut-off of 0.50 ug/mL FEU, this assay has been documented to exclude PE with a sensitivity and negative predictive value of 97 to 99%.  At this time, this assay has not been approved by the FDA to exclude DVT/VTE. Results should be correlated with clinical presentation. Performed at [facility], [address] [place], [place] [address]    Dg Chest 2 View  Result Date: 08/05/2018 CLINICAL DATA:  Dyspnea worse with exertion. EXAM: CHEST - 2 VIEW COMPARISON:  08/03/2018 FINDINGS: Stable cardiomegaly with aortic atherosclerosis. Mild central vascular congestion is noted left basilar atelectasis is present. No acute nor suspicious osseous abnormalities. Degenerative change noted about both shoulders and dorsal spine. IMPRESSION: Cardiomegaly with central vascular congestion. Electronically Signed   By: Tollie Eth M.D.   On: 08/05/2018 02:58   Ct Angio Chest Pe W And/or Wo Contrast  Result Date: 08/05/2018 CLINICAL DATA:  Dyspnea x2 weeks with exertion.  Positive D-dimer. EXAM: CT ANGIOGRAPHY CHEST WITH CONTRAST TECHNIQUE: Multidetector CT imaging of the chest was performed using the standard protocol during bolus administration of intravenous contrast. Multiplanar CT image reconstructions and MIPs were obtained to evaluate the vascular anatomy. CONTRAST:  ISOVUE-370 IOPAMIDOL (ISOVUE-370) INJECTION 76% COMPARISON:  None. FINDINGS:  Cardiovascular: Conventional branch pattern of the great vessels with mild atherosclerosis the origin of the left subclavian artery. Ectatic thoracic aorta without aneurysm or dissection. Satisfactory pulmonary arterial opacification with tiny nonocclusive bandlike pulmonary emboli to the right lower lobe. No right heart strain. Cardiomegaly without pericardial effusion or thickening. Mediastinum/Nodes: Small hilar nodes measuring up to 1 cm. No mediastinal, axillary nor supraclavicular adenopathy. Patent trachea and mainstem bronchi. Esophagus is unremarkable. Lungs/Pleura: Scarring  atelectasis at the right lung apex and upper lobe. Dependent atelectasis is otherwise noted at each lung base with subsegmental atelectasis the periphery of lingula. No or pneumothorax. Upper Abdomen: No acute abnormality. 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 Review of the MIP images confirms the above findings. IMPRESSION: 1. Nonocclusive pulmonary emboli to the right lobe without heart strain. These results were called by telephone at the time of interpretation on 08/05/2018 at 3:44 am to Dr. Glynn Octave , who verbally acknowledged these results. 2. No active pulmonary disease.  Subsegmental atelectasis as above. Electronically Signed   By: Tollie Eth M.D.   On: 08/05/2018 03:44    Pending Labs Unresulted Labs (From admission, onward)   None      Vitals/Pain Today's Vitals   08/05/18 0234 08/05/18 0300 08/05/18 0400 08/05/18 0432  BP: (!) 163/90 (!) 152/78 (!) 154/95 (!) 148/95  Pulse: 89 76 75 81  Resp: (!) 24 (!) 24 (!) 22 (!) 24  Temp:      TempSrc:      SpO2: 90% 96% 98% 98%  Weight:      Height:      PainSc:        Isolation Precautions No active isolations  Medications Medications  heparin ADULT infusion 100 units/mL (25000 units/221mL sodium chloride 0.45%) (1,700 Units/hr Intravenous New Bag/Given 08/05/18 0406)  furosemide (LASIX) injection 40 mg (40  mg Intravenous Given 08/05/18 0229)  iopamidol (ISOVUE-370) 76 % injection 100 mL (100 mLs Intravenous Contrast Given 08/05/18 0316)  heparin bolus via infusion 6,000 Units (6,000 Units Intravenous Bolus from Bag 08/05/18 0407)    Mobility  Low fall risk   Focused Assessments Pulmonary Assessment Handoff:  Lung sounds: L Breath Sounds: Diminished R Breath Sounds: Diminished O2 Device: Nasal Cannula O2 Flow Rate (L/min): 2 L/min      R Recommendations: See Admitting Provider Note  Report given to:   Additional Notes:

## 2018-08-05 NOTE — Progress Notes (Signed)
ANTICOAGULATION CONSULT NOTE - Initial Consult  Pharmacy Consult for Heparin Indication: pulmonary embolus  Allergies  Allergen Reactions  . Penicillins     Patient Measurements: Height: 5\' 10"  (177.8 cm) Weight: (!) 320 lb (145.2 kg) IBW/kg (Calculated) : 73 Heparin Dosing Weight: 110 kg  Vital Signs: Temp: 98.1 F (36.7 C) (03/14 0208) Temp Source: Oral (03/14 0208) BP: 163/90 (03/14 0234) Pulse Rate: 89 (03/14 0234)  Labs: Recent Labs    08/05/18 0221  HGB 14.2  HCT 46.4  PLT 202  CREATININE 1.55*  TROPONINI <0.03    Estimated Creatinine Clearance: 64.8 mL/min (A) (by C-G formula based on SCr of 1.55 mg/dL (H)).   Medical History: Past Medical History:  Diagnosis Date  . Arthritis   . Diabetes mellitus without complication (HCC)   . Hypertension   . OSA (obstructive sleep apnea)   . Renal disorder   . Thyroid disease     Medications:  No current facility-administered medications on file prior to encounter.    Current Outpatient Medications on File Prior to Encounter  Medication Sig Dispense Refill  . amLODipine (NORVASC) 10 MG tablet Take 10 mg by mouth daily.    Marland Kitchen aspirin 81 MG chewable tablet Chew by mouth daily.    . benzonatate (TESSALON) 100 MG capsule Take 1 capsule (100 mg total) by mouth every 8 (eight) hours. 15 capsule 0  . Cholecalciferol (VITAMIN D PO) Take by mouth.    . febuxostat (ULORIC) 40 MG tablet Take 40 mg by mouth daily.    Marland Kitchen glipiZIDE (GLUCOTROL) 10 MG tablet Take 10 mg by mouth daily before breakfast.    . levothyroxine (SYNTHROID, LEVOTHROID) 100 MCG tablet Take 100 mcg by mouth daily before breakfast.    . lovastatin (MEVACOR) 40 MG tablet Take 40 mg by mouth at bedtime.    . Omega-3 Fatty Acids (FISH OIL PO) Take by mouth.    . oxyCODONE-acetaminophen (PERCOCET) 5-325 MG per tablet Take 1-2 tablets by mouth every 6 (six) hours as needed for severe pain. 10 tablet 0  . quinapril (ACCUPRIL) 10 MG tablet Take 10 mg by mouth  daily.    Marland Kitchen torsemide (DEMADEX) 10 MG tablet Take 10 mg by mouth daily.       Assessment: 68 y.o. male with PE for heparin Goal of Therapy:  Heparin level 0.3-0.7 units/ml Monitor platelets by anticoagulation protocol: Yes   Plan:  Heparin 6000 units IV bolus, then start heparin 1700 units/hr Check heparin level in 6 hours.   Eddie Candle 08/05/2018,3:55 AM

## 2018-08-05 NOTE — Progress Notes (Signed)
Bilateral lower extremity venous duplex has been completed. Preliminary results can be found in CV Proc through chart review.   08/05/18 11:46 AM Olen Cordial RVT

## 2018-08-05 NOTE — ED Notes (Signed)
Patient transported to X-ray 

## 2018-08-05 NOTE — ED Notes (Signed)
ED Provider at bedside. 

## 2018-08-05 NOTE — ED Triage Notes (Signed)
Pt initially 84% pulse ox on RA, RT at bedside. Pt placed on oxygen  @ 2L.

## 2018-08-06 ENCOUNTER — Encounter (HOSPITAL_COMMUNITY): Payer: Self-pay | Admitting: Internal Medicine

## 2018-08-06 DIAGNOSIS — J9601 Acute respiratory failure with hypoxia: Secondary | ICD-10-CM | POA: Diagnosis not present

## 2018-08-06 DIAGNOSIS — I5031 Acute diastolic (congestive) heart failure: Secondary | ICD-10-CM

## 2018-08-06 LAB — CBC
HCT: 42.9 % (ref 39.0–52.0)
Hemoglobin: 13.6 g/dL (ref 13.0–17.0)
MCH: 25.2 pg — ABNORMAL LOW (ref 26.0–34.0)
MCHC: 31.7 g/dL (ref 30.0–36.0)
MCV: 79.6 fL — ABNORMAL LOW (ref 80.0–100.0)
Platelets: 191 10*3/uL (ref 150–400)
RBC: 5.39 MIL/uL (ref 4.22–5.81)
RDW: 16.9 % — ABNORMAL HIGH (ref 11.5–15.5)
WBC: 7.1 10*3/uL (ref 4.0–10.5)
nRBC: 0 % (ref 0.0–0.2)

## 2018-08-06 LAB — GLUCOSE, CAPILLARY
Glucose-Capillary: 161 mg/dL — ABNORMAL HIGH (ref 70–99)
Glucose-Capillary: 210 mg/dL — ABNORMAL HIGH (ref 70–99)
Glucose-Capillary: 313 mg/dL — ABNORMAL HIGH (ref 70–99)
Glucose-Capillary: 217 mg/dL — ABNORMAL HIGH (ref 70–99)

## 2018-08-06 LAB — HIV ANTIBODY (ROUTINE TESTING W REFLEX): HIV Screen 4th Generation wRfx: NONREACTIVE

## 2018-08-06 LAB — BASIC METABOLIC PANEL
Anion gap: 7 (ref 5–15)
BUN: 12 mg/dL (ref 8–23)
CALCIUM: 8.9 mg/dL (ref 8.9–10.3)
CO2: 33 mmol/L — ABNORMAL HIGH (ref 22–32)
Chloride: 99 mmol/L (ref 98–111)
Creatinine, Ser: 1.57 mg/dL — ABNORMAL HIGH (ref 0.61–1.24)
GFR calc Af Amer: 51 mL/min — ABNORMAL LOW (ref >60)
GFR calc non Af Amer: 44 mL/min — ABNORMAL LOW (ref >60)
Glucose, Bld: 210 mg/dL — ABNORMAL HIGH (ref 70–99)
Potassium: 4.2 mmol/L (ref 3.5–5.1)
SODIUM: 139 mmol/L (ref 135–145)

## 2018-08-06 MED ORDER — LISINOPRIL 20 MG PO TABS: 20.0000 mg | ORAL_TABLET | Freq: Every day | ORAL | Status: DC

## 2018-08-06 MED ORDER — ALLOPURINOL 300 MG PO TABS
300.0000 mg | ORAL_TABLET | Freq: Every day | ORAL | Status: DC
  Administered 2018-08-06 – 2018-08-07 (×2): 300 mg via ORAL
  Filled 2018-08-06 (×2): qty 1

## 2018-08-06 MED ORDER — TIZANIDINE HCL 4 MG PO TABS
4.0000 mg | ORAL_TABLET | Freq: Four times a day (QID) | ORAL | Status: DC | PRN
  Administered 2018-08-06: 4 mg via ORAL
  Filled 2018-08-06 (×3): qty 1

## 2018-08-06 MED ORDER — FUROSEMIDE 10 MG/ML IJ SOLN
40.0000 mg | Freq: Once | INTRAMUSCULAR | Status: AC
  Administered 2018-08-06: 40 mg via INTRAVENOUS
  Filled 2018-08-06: qty 4

## 2018-08-06 MED ORDER — LISINOPRIL 20 MG PO TABS
20.0000 mg | ORAL_TABLET | Freq: Every day | ORAL | Status: DC
  Administered 2018-08-07: 20 mg via ORAL
  Filled 2018-08-06: qty 1

## 2018-08-06 MED ORDER — LABETALOL HCL 200 MG PO TABS
200.0000 mg | ORAL_TABLET | Freq: Two times a day (BID) | ORAL | Status: DC
  Administered 2018-08-06 – 2018-08-07 (×3): 200 mg via ORAL
  Filled 2018-08-06 (×3): qty 1

## 2018-08-06 NOTE — Evaluation (Signed)
Physical Therapy Evaluation Patient Details Name: Todd Chambers MRN: [id number] DOB: [birth year] Today's Date: 08/06/2018   History of Present Illness  Pt is a 68 y.o. M with significant PMH of stage 3 CKD, HTN, obesity, and DM presents with SOB. Found to have acute PE.   Clinical Impression  Pt admitted with above diagnosis. Pt currently with functional limitations due to the deficits listed below (see PT Problem List). Pt independent prior to admission but reports being sedentary due to back pain. Ambulating 75 feet with walker, desaturation to 86% on RA, returned to 1L O2 with rebound to 96% SpO2. Presenting with decreased cardiovascular endurance and balance impairments. Pt will benefit from skilled PT to increase their independence and safety with mobility to allow discharge to the venue listed below.       Follow Up Recommendations Home health PT    Equipment Recommendations  Other (comment)(Rollator)    Recommendations for Other Services       Precautions / Restrictions Precautions Precautions: Fall Precaution Comments: watch O2 Restrictions Weight Bearing Restrictions: No      Mobility  Bed Mobility Overal bed mobility: Modified Independent                Transfers Overall transfer level: Needs assistance Equipment used: None Transfers: Sit to/from Stand Sit to Stand: Supervision            Ambulation/Gait Ambulation/Gait assistance: Supervision Gait Distance (Feet): 75 Feet Assistive device: Rolling walker (2 wheeled) Gait Pattern/deviations: Step-through pattern;Decreased stride length;Trunk flexed Gait velocity: decr   General Gait Details: decreased proximity to walker, back pain towards end of walk  Stairs            Wheelchair Mobility    Modified Rankin (Stroke Patients Only)       Balance Overall balance assessment: Needs assistance Sitting-balance support: Feet supported Sitting balance-Leahy Scale: Good     Standing balance  support: No upper extremity supported;During functional activity Standing balance-Leahy Scale: Fair                               Pertinent Vitals/Pain Pain Assessment: Faces Faces Pain Scale: Hurts little more Pain Location: back with mobility Pain Descriptors / Indicators: Aching Pain Intervention(s): Monitored during session    Home Living Family/patient expects to be discharged to:: Private residence Living Arrangements: Spouse/significant other Available Help at Discharge: Family Type of Home: House Home Access: Level entry     Home Layout: One level Home Equipment: Cane - single point      Prior Function Level of Independence: Independent         Comments: Increasingly sedentary due to pt self report of back pain related to arthritis     Hand Dominance        Extremity/Trunk Assessment   Upper Extremity Assessment Upper Extremity Assessment: Overall WFL for tasks assessed    Lower Extremity Assessment Lower Extremity Assessment: Overall WFL for tasks assessed       Communication   Communication: No difficulties  Cognition Arousal/Alertness: Awake/alert Behavior During Therapy: WFL for tasks assessed/performed Overall Cognitive Status: Within Functional Limits for tasks assessed                                        General Comments      Exercises     Assessment/Plan  PT Assessment Patient needs continued PT services  PT Problem List Decreased strength;Decreased activity tolerance;Decreased balance;Decreased mobility;Cardiopulmonary status limiting activity       PT Treatment Interventions DME instruction;Gait training;Functional mobility training;Therapeutic activities;Therapeutic exercise;Balance training;Patient/family education    PT Goals (Current goals can be found in the Care Plan section)  Acute Rehab PT Goals Patient Stated Goal: less pain PT Goal Formulation: With patient Time For Goal  Achievement: 08/20/18 Potential to Achieve Goals: Good    Frequency Min 3X/week   Barriers to discharge        Co-evaluation               AM-PAC PT "6 Clicks" Mobility  Outcome Measure Help needed turning from your back to your side while in a flat bed without using bedrails?: None Help needed moving from lying on your back to sitting on the side of a flat bed without using bedrails?: None Help needed moving to and from a bed to a chair (including a wheelchair)?: None Help needed standing up from a chair using your arms (e.g., wheelchair or bedside chair)?: None Help needed to walk in hospital room?: None Help needed climbing 3-5 steps with a railing? : A Lot 6 Click Score: 22    End of Session   Activity Tolerance: Patient tolerated treatment well Patient left: in bed;with call bell/phone within reach Nurse Communication: Mobility status PT Visit Diagnosis: Difficulty in walking, not elsewhere classified (R26.2)    Time: 3662-9476 PT Time Calculation (min) (ACUTE ONLY): 13 min   Charges:   PT Evaluation $PT Eval Moderate Complexity: 1 Mod         Laurina Bustle, PT, DPT Acute Rehabilitation Services Pager [phone] Office [phone]   Vanetta Mulders 08/06/2018, 2:05 PM

## 2018-08-06 NOTE — Progress Notes (Signed)
Patient ambulated with walker and standby assist on room air 25 feet.  Oxygen saturation before walking 95%, dropped to 84% with walking.  Patient denied SOB, but states back hurts with walking.

## 2018-08-06 NOTE — Progress Notes (Signed)
Eliquis coupon card given to patient with explanation of usage, all questions answered; Alexis Goodell [phone]

## 2018-08-06 NOTE — Progress Notes (Signed)
[facility] PROGRESS NOTE  Todd Chambers DIY:[id number] DOB: 1950/07/01 DOA: 08/05/2018 PCP: Clide Dales, PA  Assessment/Plan:   Acute respiratory failure with hypoxia: likely related to acute PE in setting of acute diastolic HF. Oxygen saturation level dropped 84% with walking. Oxygen saturation level 92% on room air at rest. Chest xray with vascular congestion. He received IV lasix 3/14 in ED.  Echo with EF 55% and moderate concentric left ventricular hypertrophy. LE doppler no evidence of DVT in somewhat limited study -continue oxygen supplementation -Lasix 40mg  IV x1. Will re-evaluate tomorrow for need for subsequent doses -monitor intake and output -daily weight -wean oxygen as able -continue eliquis  Acute diastolic heart failure. Echo as noted above. He received IV lasix esterday in ED.  Home meds include demadex, BB and ace inhibitor.  -hold demadex -lasix IV as noted above -continue home BB -continue norvasc -resume home ace inhibitor in am -daily weight -monitor intake and output  PE: Patient without prior episodes of thromboembolic disease presented with new PE. He has had about 2 weeks of progressive SOB. His most likely risk factor is his general immobility associated with his weight in conjunction with OA. Chest xray with vascular congestion. Echo with EF 55% and moderate concentric left ventricular hypertrophy.   DM -A1c in 10/19 was 8.5 -Hold Glucotrol -Cover with moderate-scale SSI   HTN. Fair control. Home meds include BB, ace inhibitor, demadex -Continue Norvasc - see above -monitor closely  CKD stage 3 -Appears to be relatively stable at this time -Recheck BMP in the AM  Obesity -He should be encouraged to increase mobility, although this is likely to be a challenge  Hypothyroidism -Normal TSH in 10/19 -Continue Synthroid at current dose for now    Code Status: full Family Communication: none present Disposition Plan: home  hopefully tomorrow   Consultants:    Procedures:  Echo as noted above  Antibiotics: HPI/Subjective: 68 yo hx ckd 3, OSA, htn, dm presents with sob. Workup reveals PE and xray with vascular congestion. Provided with heparin and IV lasix. Heparin switched to eliquis and IV lasix continued due to acute respiratory failure.   Objective: Vitals:   08/06/18 0856 08/06/18 1331  BP: 129/78 111/77  Pulse: 92 84  Resp:    Temp:    SpO2: 92% 96%    Intake/Output Summary (Last 24 hours) at 08/06/2018 1354 Last data filed at 08/06/2018 1158 Gross per 24 hour  Intake 465 ml  Output 1850 ml  Net -1385 ml   Filed Weights   08/05/18 0205 08/05/18 0700 08/06/18 0409  Weight: (!) 145.2 kg (!) 140.5 kg (!) 139.3 kg    Exam:   General:  Sitting up in bed no acute distress  Cardiovascular: rrr no mgr no LE edema  Respiratory: mild to moderated increased work of breathing with conversation. BS diminished crackles throughout no wheeze  Abdomen: obese soft +BS no guarding or rebounding  Musculoskeletal: joints without swelling/erythema   Data Reviewed: Basic Metabolic Panel: Recent Labs  Lab 08/05/18 0221 08/06/18 0441  NA 138 139  K 3.8 4.2  CL 101 99  CO2 31 33*  GLUCOSE 174* 210*  BUN 15 12  CREATININE 1.55* 1.57*  CALCIUM 9.0 8.9   Liver Function Tests: No results for input(s): AST, ALT, ALKPHOS, BILITOT, PROT, ALBUMIN in the last 168 hours. No results for input(s): LIPASE, AMYLASE in the last 168 hours. No results for input(s): AMMONIA in the last 168 hours. CBC: Recent Labs  Lab 08/05/18  0221 08/06/18 0441  WBC 6.9 7.1  NEUTROABS 3.6  --   HGB 14.2 13.6  HCT 46.4 42.9  MCV 81.0 79.6*  PLT 202 191   Cardiac Enzymes: Recent Labs  Lab 08/05/18 0221  TROPONINI <0.03   BNP (last 3 results) Recent Labs    08/05/18 0221  BNP 39.4    ProBNP (last 3 results) No results for input(s): PROBNP in the last 8760 hours.  CBG: Recent Labs  Lab  08/05/18 1222 08/05/18 1657 08/05/18 2137 08/06/18 0724 08/06/18 1114  GLUCAP 192* 243* 270* 161* 210*    No results found for this or any previous visit (from the past 240 hour(s)).   Studies: Dg Chest 2 View  Result Date: 08/05/2018 CLINICAL DATA:  Dyspnea worse with exertion. EXAM: CHEST - 2 VIEW COMPARISON:  08/03/2018 FINDINGS: Stable cardiomegaly with aortic atherosclerosis. Mild central vascular congestion is noted left basilar atelectasis is present. No acute nor suspicious osseous abnormalities. Degenerative change noted about both shoulders and dorsal spine. IMPRESSION: Cardiomegaly with central vascular congestion. Electronically Signed   By: Tollie Eth M.D.   On: 08/05/2018 02:58   Ct Angio Chest Pe W And/or Wo Contrast  Result Date: 08/05/2018 CLINICAL DATA:  Dyspnea x2 weeks with exertion.  Positive D-dimer. EXAM: CT ANGIOGRAPHY CHEST WITH CONTRAST TECHNIQUE: Multidetector CT imaging of the chest was performed using the standard protocol during bolus administration of intravenous contrast. Multiplanar CT image reconstructions and MIPs were obtained to evaluate the vascular anatomy. CONTRAST:  ISOVUE-370 IOPAMIDOL (ISOVUE-370) INJECTION 76% COMPARISON:  None. FINDINGS: Cardiovascular: Conventional branch pattern of the great vessels with mild atherosclerosis the origin of the left subclavian artery. Ectatic thoracic aorta without aneurysm or dissection. Satisfactory pulmonary arterial opacification with tiny nonocclusive bandlike pulmonary emboli to the right lower lobe. No right heart strain. Cardiomegaly without pericardial effusion or thickening. Mediastinum/Nodes: Small hilar nodes measuring up to 1 cm. No mediastinal, axillary nor supraclavicular adenopathy. Patent trachea and mainstem bronchi. Esophagus is unremarkable. Lungs/Pleura: Scarring atelectasis at the right lung apex and upper lobe. Dependent atelectasis is otherwise noted at each lung base with subsegmental  atelectasis the periphery of lingula. No or pneumothorax. Upper Abdomen: No acute abnormality. 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 Review of the MIP images confirms the above findings. IMPRESSION: 1. Nonocclusive pulmonary emboli to the right lobe without heart strain. These results were called by telephone at the time of interpretation on 08/05/2018 at 3:44 am to Dr. Glynn Octave , who verbally acknowledged these results. 2. No active pulmonary disease.  Subsegmental atelectasis as above. Electronically Signed   By: Tollie Eth M.D.   On: 08/05/2018 03:44   Vas [place] Lower Extremity Venous (dvt)  Result Date: 08/05/2018  Lower Venous Study Indications: Pulmonary embolism.  Limitations: Body habitus and poor ultrasound/tissue interface. Performing Technologist: Chanda Busing RVT  Examination Guidelines: A complete evaluation includes B-mode imaging, spectral Doppler, color Doppler, and power Doppler as needed of all accessible portions of each vessel. Bilateral testing is considered an integral part of a complete examination. Limited examinations for reoccurring indications may be performed as noted.  Right Venous Findings: +---------+---------------+---------+-----------+----------+--------------+          CompressibilityPhasicitySpontaneityPropertiesSummary        +---------+---------------+---------+-----------+----------+--------------+ CFV      Full           Yes      Yes                                 +---------+---------------+---------+-----------+----------+--------------+  SFJ      Full                                                        +---------+---------------+---------+-----------+----------+--------------+ FV Prox  Full                                                        +---------+---------------+---------+-----------+----------+--------------+ FV Mid   Full                                                         +---------+---------------+---------+-----------+----------+--------------+ FV Distal               Yes      Yes                                 +---------+---------------+---------+-----------+----------+--------------+ PFV      Full                                                        +---------+---------------+---------+-----------+----------+--------------+ POP      Full           Yes      Yes                                 +---------+---------------+---------+-----------+----------+--------------+ PTV      Full                                                        +---------+---------------+---------+-----------+----------+--------------+ PERO                                                  Not visualized +---------+---------------+---------+-----------+----------+--------------+  Left Venous Findings: +---------+---------------+---------+-----------+----------+--------------+          CompressibilityPhasicitySpontaneityPropertiesSummary        +---------+---------------+---------+-----------+----------+--------------+ CFV      Full           Yes      Yes                                 +---------+---------------+---------+-----------+----------+--------------+ SFJ      Full                                                        +---------+---------------+---------+-----------+----------+--------------+  FV Prox  Full                                                        +---------+---------------+---------+-----------+----------+--------------+ FV Mid   Full                                                        +---------+---------------+---------+-----------+----------+--------------+ FV DistalFull                                                        +---------+---------------+---------+-----------+----------+--------------+ PFV      Full                                                         +---------+---------------+---------+-----------+----------+--------------+ POP      Full           Yes      Yes                                 +---------+---------------+---------+-----------+----------+--------------+ PTV      Full                                                        +---------+---------------+---------+-----------+----------+--------------+ PERO                                                  Not visualized +---------+---------------+---------+-----------+----------+--------------+    Summary: Right: There is no evidence of deep vein thrombosis in the lower extremity. However, portions of this examination were limited- see technologist comments above. No cystic structure found in the popliteal fossa. Left: There is no evidence of deep vein thrombosis in the lower extremity. However, portions of this examination were limited- see technologist comments above. No cystic structure found in the popliteal fossa.  *See table(s) above for measurements and observations.    Preliminary     Scheduled Meds: . allopurinol  300 mg Oral Daily  . amLODipine  10 mg Oral Daily  . apixaban  10 mg Oral BID   Followed by  . [START ON 08/13/2018] apixaban  5 mg Oral BID  . benzonatate  100 mg Oral Q8H  . docusate sodium  100 mg Oral BID  . insulin aspart  0-15 Units Subcutaneous TID WC  . insulin aspart  0-5 Units Subcutaneous QHS  . levothyroxine  100 mcg Oral QAC breakfast  . pravastatin  40 mg Oral q1800  . sodium chloride flush  3  mL Intravenous Q12H   Continuous Infusions:  Principal Problem:   Acute respiratory failure with hypoxia (HCC) Active Problems:   Acute pulmonary embolism (HCC)   Essential hypertension   CKD (chronic kidney disease) stage 3, GFR 30-59 ml/min (HCC)   Diabetes mellitus type 2 in obese (HCC)   Obesity, Class III, BMI 40-49.9 (morbid obesity) (HCC)   Acute diastolic heart failure (HCC)    Time spent: 45 minutes   [facility] M  NP  [facility]  If 7PM-7AM, please contact night-coverage at [email or web address], password [facility] 08/06/2018, 1:54 PM  LOS: 0 days

## 2018-08-07 DIAGNOSIS — J9601 Acute respiratory failure with hypoxia: Secondary | ICD-10-CM | POA: Diagnosis not present

## 2018-08-07 DIAGNOSIS — R2689 Other abnormalities of gait and mobility: Secondary | ICD-10-CM | POA: Diagnosis not present

## 2018-08-07 LAB — BASIC METABOLIC PANEL
Anion gap: 9 (ref 5–15)
BUN: 16 mg/dL (ref 8–23)
CHLORIDE: 98 mmol/L (ref 98–111)
CO2: 33 mmol/L — AB (ref 22–32)
Calcium: 9 mg/dL (ref 8.9–10.3)
Creatinine, Ser: 1.79 mg/dL — ABNORMAL HIGH (ref 0.61–1.24)
GFR calc Af Amer: 44 mL/min — ABNORMAL LOW (ref >60)
GFR calc non Af Amer: 38 mL/min — ABNORMAL LOW (ref >60)
Glucose, Bld: 180 mg/dL — ABNORMAL HIGH (ref 70–99)
Potassium: 4 mmol/L (ref 3.5–5.1)
Sodium: 140 mmol/L (ref 135–145)

## 2018-08-07 LAB — GLUCOSE, CAPILLARY
Glucose-Capillary: 201 mg/dL — ABNORMAL HIGH (ref 70–99)
Glucose-Capillary: 270 mg/dL — ABNORMAL HIGH (ref 70–99)

## 2018-08-07 MED ORDER — TORSEMIDE 20 MG PO TABS
10.0000 mg | ORAL_TABLET | ORAL | Status: DC
  Administered 2018-08-07: 10 mg via ORAL
  Filled 2018-08-07: qty 1

## 2018-08-07 MED ORDER — ELIQUIS 5 MG VTE STARTER PACK: ORAL_TABLET | ORAL | 0 refills | Status: DC

## 2018-08-07 MED FILL — ELIQUIS STARTER PACK 5 MG T: 5 | 30 days supply | Qty: 74 | Fill #0

## 2018-08-07 NOTE — Consult Note (Signed)
   [facility] CM Inpatient Consult   08/07/2018  Vikash Stenman 1950/07/21 [id number]   Referral received for post hospital follow up.  Patient is in the HTA C_SNP program with [facility] Care Management team.  Will update the team on patient post hospital needs.  Patient was in observation status.  Transition of Care Coordinator/RNCM wants pharmacy to follow up for medication management needs.  For questions, please contact:  Charlesetta Shanks, RN BSN CCM [facility]  [phone] business mobile phone Toll free office [phone]

## 2018-08-07 NOTE — Progress Notes (Signed)
Discharge instructions (including medications) discussed with and copy provided to patient/caregiver 

## 2018-08-07 NOTE — Progress Notes (Signed)
Patient taken off oxygen.  Sitting on side of bed, oxygen satureation 92 to 98%.  Ambulated 50 feet with walker.  Oxygen saturation 90% during ambulation on room air, denies SOB.  Tolerated ambulation, states both knees painful with walking.  Oxygen  Saturation 92% after sitting on bedside for 5 minutes.

## 2018-08-07 NOTE — Discharge Summary (Signed)
Physician Discharge Summary  Todd Chambers YNW:[id number] DOB: 06-30-1950 DOA: 08/05/2018  PCP: Clide Dales, PA  Admit date: 08/05/2018 Discharge date: 08/07/2018  Admitted From: home Discharge disposition: home   Recommendations for Outpatient Follow-Up:   eliquis started BMP 1 week Needs titration of his diuretic and diabetic medications Home health  Discharge Diagnosis:   Principal Problem:   Acute respiratory failure with hypoxia (HCC) Active Problems:   Acute pulmonary embolism (HCC)   Diabetes mellitus type 2 in obese [facility])   Essential hypertension   CKD (chronic kidney disease) stage 3, GFR 30-59 ml/min (HCC)   Obesity, Class III, BMI 40-49.9 (morbid obesity) (HCC)   Acute diastolic heart failure (HCC)    Discharge Condition: Improved.  Diet recommendation: Low sodium, heart healthy.  Carbohydrate-modified.  Wound care: None.  Code status: Full.   History of Present Illness:   Todd Chambers is a 68 y.o. male with medical history significant of hypothyroidism; stage 3 CKD; OSA; HTN; morbid obesity (BMI 44); and DM presenting with SOB. Patient got very SOB.  Symptoms started about 2 weeks.  It started intermittent but progressed to constant last week.  No cough.  He felt like he was congested and would use cough drops.  He felt like he was panicking and couldn't catch his breath.  He has a remote h/o PNA and last year he was given an inhaler; he used that without relief.   Finally, his daughter came to check on him and she made him go to the ER.  His left hand was swollen one day and both legs looked a little swollen. He has had cramping a lot too.  He is immobile due to arthritis.  No recent surgeries or broken bones.  No recent travel.   Hospital Course by Problem:   Acute respiratory failure with hypoxia: likely related to acute PE in setting of acute diastolic HF. Oxygen saturation level dropped 84% with walking.  -resolved with further  diuresis  Acute diastolic heart failure. Echo as noted above. -given IV Lasix -resume home meds -consider titration as an outpatient  PE: Patient withoutprior episodes of thromboembolic disease presented with new PE. He has had about 2 weeks of progressive SOB. His most likely risk factor is his general immobility associated with his weight in conjunction with OA. Chest xray with vascular congestion. Echo with EF 55% and moderate concentric left ventricular hypertrophy.  -eliquis started  DM -A1c in 10/19 was 8.5 -Hold Glucotrol -Cover with moderate-scale SSI  HTN.  -resume home meds  CKD stage 3 -Appears to be relatively stable at this time -close outpatient follow up  Obesity Estimated body mass index is 44.02 kg/m as calculated from the following:   Height as of this encounter:  (1.778 m).   Weight as of this encounter: 139.2 kg.  Hypothyroidism -Normal TSH in 10/19 -Continue Synthroid at current dose for now    Medical Consultants:      Discharge Exam:   Vitals:   08/06/18 2030 08/07/18 0342  BP: 121/75 130/79  Pulse: 76 78  Resp: 19 (!) 21  Temp: 98.4 F (36.9 C) 98.3 F (36.8 C)  SpO2: 97% 97%   Vitals:   08/06/18 1418 08/06/18 1701 08/06/18 2030 08/07/18 0342  BP: 105/70 121/75 121/75 130/79  Pulse: 82 92 76 78  Resp:  18 19 (!) 21  Temp: (!) 97.5 F (36.4 C)  98.4 F (36.9 C) 98.3 F (36.8 C)  TempSrc: Oral  Oral Oral  SpO2: 99%  97% 97%  Weight:    (!) 139.2 kg  Height:        General exam: Appears calm and comfortable.    The results of significant diagnostics from this hospitalization (including imaging, microbiology, ancillary and laboratory) are listed below for reference.     Procedures and Diagnostic Studies:   Dg Chest 2 View  Result Date: 08/05/2018 CLINICAL DATA:  Dyspnea worse with exertion. EXAM: CHEST - 2 VIEW COMPARISON:  08/03/2018 FINDINGS: Stable cardiomegaly with aortic atherosclerosis. Mild central  vascular congestion is noted left basilar atelectasis is present. No acute nor suspicious osseous abnormalities. Degenerative change noted about both shoulders and dorsal spine. IMPRESSION: Cardiomegaly with central vascular congestion. Electronically Signed   By: Tollie Eth M.D.   On: 08/05/2018 02:58   Ct Angio Chest Pe W And/or Wo Contrast  Result Date: 08/05/2018 CLINICAL DATA:  Dyspnea x2 weeks with exertion.  Positive D-dimer. EXAM: CT ANGIOGRAPHY CHEST WITH CONTRAST TECHNIQUE: Multidetector CT imaging of the chest was performed using the standard protocol during bolus administration of intravenous contrast. Multiplanar CT image reconstructions and MIPs were obtained to evaluate the vascular anatomy. CONTRAST:  ISOVUE-370 IOPAMIDOL (ISOVUE-370) INJECTION 76% COMPARISON:  None. FINDINGS: Cardiovascular: Conventional branch pattern of the great vessels with mild atherosclerosis the origin of the left subclavian artery. Ectatic thoracic aorta without aneurysm or dissection. Satisfactory pulmonary arterial opacification with tiny nonocclusive bandlike pulmonary emboli to the right lower lobe. No right heart strain. Cardiomegaly without pericardial effusion or thickening. Mediastinum/Nodes: Small hilar nodes measuring up to 1 cm. No mediastinal, axillary nor supraclavicular adenopathy. Patent trachea and mainstem bronchi. Esophagus is unremarkable. Lungs/Pleura: Scarring atelectasis at the right lung apex and upper lobe. Dependent atelectasis is otherwise noted at each lung base with subsegmental atelectasis the periphery of lingula. No or pneumothorax. Upper Abdomen: No acute abnormality. 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 Review of the MIP images confirms the above findings. IMPRESSION: 1. Nonocclusive pulmonary emboli to the right lobe without heart strain. These results were called by telephone at the time of interpretation on 08/05/2018 at 3:44 am to Dr.  Glynn Octave , who verbally acknowledged these results. 2. No active pulmonary disease.  Subsegmental atelectasis as above. Electronically Signed   By: Tollie Eth M.D.   On: 08/05/2018 03:44   Vas [place] Lower Extremity Venous (dvt)  Result Date: 08/07/2018  Lower Venous Study Indications: Pulmonary embolism.  Limitations: Body habitus and poor ultrasound/tissue interface. Performing Technologist: Chanda Busing RVT  Examination Guidelines: A complete evaluation includes B-mode imaging, spectral Doppler, color Doppler, and power Doppler as needed of all accessible portions of each vessel. Bilateral testing is considered an integral part of a complete examination. Limited examinations for reoccurring indications may be performed as noted.  Right Venous Findings: +---------+---------------+---------+-----------+----------+--------------+            Compressibility Phasicity Spontaneity Properties Summary         +---------+---------------+---------+-----------+----------+--------------+  CFV       Full            Yes       Yes                                    +---------+---------------+---------+-----------+----------+--------------+  SFJ       Full                                                             +---------+---------------+---------+-----------+----------+--------------+  FV Prox   Full                                                             +---------+---------------+---------+-----------+----------+--------------+  FV Mid    Full                                                             +---------+---------------+---------+-----------+----------+--------------+  FV Distal                 Yes       Yes                                    +---------+---------------+---------+-----------+----------+--------------+  PFV       Full                                                             +---------+---------------+---------+-----------+----------+--------------+  POP       Full            Yes        Yes                                    +---------+---------------+---------+-----------+----------+--------------+  PTV       Full                                                             +---------+---------------+---------+-----------+----------+--------------+  PERO                                                       Not visualized  +---------+---------------+---------+-----------+----------+--------------+  Left Venous Findings: +---------+---------------+---------+-----------+----------+--------------+            Compressibility Phasicity Spontaneity Properties Summary         +---------+---------------+---------+-----------+----------+--------------+  CFV       Full            Yes       Yes                                    +---------+---------------+---------+-----------+----------+--------------+  SFJ       Full                                                             +---------+---------------+---------+-----------+----------+--------------+  FV Prox   Full                                                             +---------+---------------+---------+-----------+----------+--------------+  FV Mid    Full                                                             +---------+---------------+---------+-----------+----------+--------------+  FV Distal Full                                                             +---------+---------------+---------+-----------+----------+--------------+  PFV       Full                                                             +---------+---------------+---------+-----------+----------+--------------+  POP       Full            Yes       Yes                                    +---------+---------------+---------+-----------+----------+--------------+  PTV       Full                                                             +---------+---------------+---------+-----------+----------+--------------+  PERO                                                        Not visualized  +---------+---------------+---------+-----------+----------+--------------+    Summary: Right: There is no evidence of deep vein thrombosis in the lower extremity. However, portions of this examination were limited- see technologist comments above. No cystic structure found in the popliteal fossa. Left: There is no evidence of deep vein thrombosis in the lower extremity. However, portions of this examination were limited- see technologist comments above. No cystic structure found in the popliteal fossa.  *See table(s) above for measurements and observations. Electronically signed by Gretta Began MD on 08/07/2018 at 7:42:14 AM.    Final      Labs:   Basic Metabolic Panel: Recent Labs  Lab 08/05/18 0221 08/06/18 0441 08/07/18 0338  NA 138 139 140  K 3.8 4.2 4.0  CL 101 99 98  CO2 31 33* 33*  GLUCOSE 174* 210*  180*  BUN CREATININE 1.55* 1.57* 1.79*  CALCIUM 9.0 8.9 9.0   GFR Estimated Creatinine Clearance: 54.8 mL/min (A) (by C-G formula based on SCr of 1.79 mg/dL (H)). Liver Function Tests: No results for input(s): AST, ALT, ALKPHOS, BILITOT, PROT, ALBUMIN in the last 168 hours. No results for input(s): LIPASE, AMYLASE in the last 168 hours. No results for input(s): AMMONIA in the last 168 hours. Coagulation profile No results for input(s): INR, PROTIME in the last 168 hours.  CBC: Recent Labs  Lab 08/05/18 0221 08/06/18 0441  WBC 6.9 7.1  NEUTROABS 3.6  --   HGB 14.2 13.6  HCT 46.4 42.9  MCV 81.0 79.6*  PLT 202 191   Cardiac Enzymes: Recent Labs  Lab 08/05/18 0221  TROPONINI <0.03   BNP: Invalid input(s): POCBNP CBG: Recent Labs  Lab 08/06/18 0724 08/06/18 1114 08/06/18 1651 08/06/18 2111 08/07/18 0815  GLUCAP 161* 210* 313* 217* 201*   D-Dimer Recent Labs    08/05/18 0221  DDIMER 1.36*   Hgb A1c Recent Labs    08/05/18 1051  HGBA1C 7.5*   Lipid Profile No results for input(s): CHOL, HDL, LDLCALC, TRIG, CHOLHDL, LDLDIRECT  in the last 72 hours. Thyroid function studies No results for input(s): TSH, T4TOTAL, T3FREE, THYROIDAB in the last 72 hours.  Invalid input(s): FREET3 Anemia work up No results for input(s): VITAMINB12, FOLATE, FERRITIN, TIBC, IRON, RETICCTPCT in the last 72 hours. Microbiology No results found for this or any previous visit (from the past 240 hour(s)).   Discharge Instructions:   Discharge Instructions    (HEART FAILURE PATIENTS) Call MD:  Anytime you have any of the following symptoms: 1) 3 pound weight gain in 24 hours or 5 pounds in 1 week 2) shortness of breath, with or without a dry hacking cough 3) swelling in the hands, feet or stomach 4) if you have to sleep on extra pillows at night in order to breathe.   Complete by:  As directed    Diet - low sodium heart healthy   Complete by:  As directed    Diet Carb Modified   Complete by:  As directed    Increase activity slowly   Complete by:  As directed      Allergies as of 08/07/2018      Reactions   Pioglitazone Other (See Comments)   Statins Other (See Comments)   Oxycodone-acetaminophen Nausea Only   Penicillins Rash   Did it involve swelling of the face/tongue/throat, SOB, or low BP? No Did it involve sudden or severe rash/hives, skin peeling, or any reaction on the inside of your mouth or nose? No Did you need to seek medical attention at a hospital or doctor's office? No When did it last happen?Childhood If all above answers are "NO", may proceed with cephalosporin use.      Medication List    TAKE these medications   allopurinol 300 MG tablet Commonly known as:  ZYLOPRIM Take 300 mg by mouth daily.   aspirin 81 MG chewable tablet Chew by mouth daily.   Eliquis DVT/PE Starter Pack 5 MG Tabs Take as directed on package: start with two-5mg  tablets twice daily for 7 days. On day 8, switch to one-5mg  tablet twice daily.   FISH OIL PO Take by mouth.   glipiZIDE 10 MG 24 hr tablet Commonly known as:   GLUCOTROL XL Take 10 mg by mouth daily with breakfast.   labetalol 200 MG tablet Commonly known as:  NORMODYNE Take 200 mg by mouth 2 (two) times daily.   levothyroxine 100 MCG tablet Commonly known as:  SYNTHROID, LEVOTHROID Take 100 mcg by mouth daily before breakfast.   lovastatin 40 MG tablet Commonly known as:  MEVACOR Take 40 mg by mouth at bedtime.   NovoLIN 70/30 ReliOn (70-30) 100 UNIT/ML injection Generic drug:  insulin NPH-regular Human Inject 45.5-95 Units into the skin See admin instructions. Take 95 units in the morning and 45.5 units in the evening   quinapril 20 MG tablet Commonly known as:  ACCUPRIL Take 20 mg by mouth daily.   tadalafil 5 MG tablet Commonly known as:  CIALIS Take 5 mg by mouth daily.   tiZANidine 4 MG tablet Commonly known as:  ZANAFLEX Take 4 mg by mouth every 6 (six) hours as needed for muscle spasms.   torsemide 20 MG tablet Commonly known as:  DEMADEX Take 10 mg by mouth every Monday, Wednesday, and Friday.   VITAMIN D PO Take by mouth.      Follow-up Information    Real ConsWright, Morgan Dionne, PA Follow up in 1 week(s).   Specialty:  Family Medicine Contact information: [place] [place] [id number] [phone]            Time coordinating discharge: 25 min  Signed:  Joseph ArtJessica U Donnajean Chesnut DO  [facility] 08/07/2018, 9:32 AM

## 2018-08-07 NOTE — TOC Initial Note (Signed)
Transition of Care [facility]) - Initial/Assessment Note    Patient Details  Name: Todd Chambers MRN: [id number] Date of Birth: 02-16-1951  [facility] CM/SW Contact:    Gala Lewandowsky, RN Phone Number: 08/07/2018, 11:08 AM  Clinical Narrative: Pt presented for Acute Respiratory Failure. PTA from home with spouse. Patient has DME Rollator to be ordered via [employer] and [facility] Referral sent to [facility]. SOC to begin within 24-48 hours post transition home. Son to transport patient home. No further needs from CM at this time.               Expected Discharge Plan: Home w Home Health Services Barriers to Discharge: No Barriers Identified   Patient Goals and CMS Choice Patient states their goals for this hospitalization and ongoing recovery are:: to stay at home CMS Medicare.gov Compare Post Acute Care list provided to:: Patient Choice offered to / list presented to : Patient, Adult Children  Expected Discharge Plan and Services Expected Discharge Plan: Home w Home Health Services Discharge Planning Services: CM Consult Post Acute Care Choice: Durable Medical Equipment, Home Health Living arrangements for the past 2 months: Single Family Home Expected Discharge Date: 08/07/18               DME Arranged: N/A DME Agency: [employer] HH Arranged: PT HH Agency: [facility]  Prior Living Arrangements/Services Living arrangements for the past 2 months: Single Family Home Lives with:: Spouse Patient language and need for interpreter reviewed:: No Do you feel safe going back to the place where you live?: Yes      Need for Family Participation in Patient Care: No (Comment) Care giver support system in place?: Yes (comment)   Criminal Activity/Legal Involvement Pertinent to Current Situation/Hospitalization: No - Comment as needed  Activities of Daily Living Home Assistive Devices/Equipment: Cane (specify quad or straight) ADL Screening (condition at time of  admission) Patient's cognitive ability adequate to safely complete daily activities?: Yes Is the patient deaf or have difficulty hearing?: No Does the patient have difficulty seeing, even when wearing glasses/contacts?: No Does the patient have difficulty concentrating, remembering, or making decisions?: No Patient able to express need for assistance with ADLs?: No Does the patient have difficulty dressing or bathing?: Yes Independently performs ADLs?: No Communication: Independent Dressing (OT): Needs assistance Is this a change from baseline?: Change from baseline, expected to last <3days Grooming: Independent Feeding: Independent Bathing: Needs assistance Is this a change from baseline?: Change from baseline, expected to last <3 days Toileting: Independent In/Out Bed: Independent Walks in Home: Independent with device (comment) Does the patient have difficulty walking or climbing stairs?: Yes Weakness of Legs: Both Weakness of Arms/Hands: None  Permission Sought/Granted Permission sought to share information with : Case Manager Permission granted to share information with : Yes, Release of Information Signed  Share Information with NAME: Lenase Hennings (Spouse)  Permission granted to share info w AGENCY: [facility]        Emotional Assessment Appearance:: Appears stated age Attitude/Demeanor/Rapport: Gracious Affect (typically observed): Accepting Orientation: : Oriented to Self, Oriented to Place, Oriented to  Time, Oriented to Situation Alcohol / Substance Use: Not Applicable Psych Involvement: No (comment)  Admission diagnosis:  Acute respiratory failure with hypoxia (HCC) [J96.01] Acute on chronic congestive heart failure, unspecified heart failure type (HCC) [I50.9] Single subsegmental pulmonary embolism without acute cor pulmonale [I26.93] Patient Active Problem List   Diagnosis Date Noted  . Acute respiratory failure with hypoxia (HCC) 08/06/2018  .  Acute  diastolic heart failure (HCC) 08/06/2018  . Acute pulmonary embolism (HCC) 08/05/2018  . Diabetes mellitus type 2 in obese (HCC) 08/05/2018  . Essential hypertension 08/05/2018  . CKD (chronic kidney disease) stage 3, GFR 30-59 ml/min (HCC) 08/05/2018  . Obesity, Class III, BMI 40-49.9 (morbid obesity) (HCC) 08/05/2018   PCP:  Clide Dales, PA Pharmacy:   [facility] [address] [place] - [address] [place] [address] Phone: [phone] Fax: [phone]  [facility] [place], [place] - [address] [place] [address] Phone: [phone] Fax: [phone]     Social Determinants of Health (SDOH) Interventions    Readmission Risk Interventions  No flowsheet data found.

## 2018-08-08 ENCOUNTER — Other Ambulatory Visit: Payer: Self-pay

## 2018-08-08 DIAGNOSIS — N390 Urinary tract infection, site not specified: Secondary | ICD-10-CM | POA: Diagnosis not present

## 2018-08-08 DIAGNOSIS — I10 Essential (primary) hypertension: Secondary | ICD-10-CM | POA: Diagnosis not present

## 2018-08-08 DIAGNOSIS — N183 Chronic kidney disease, stage 3 (moderate): Secondary | ICD-10-CM | POA: Diagnosis not present

## 2018-08-08 NOTE — Patient Outreach (Signed)
  [facility]) Care Management Chronic Special Needs Program  08/08/2018  Name: Todd Chambers DOB: 02/10/1951  MRN: [id number]  Mr. Kristie Gonzalo is enrolled in a chronic special needs plan for Diabetes. Client was in hospital for observation of acute pulmonary embolism and respiratory failure Reviewed and updated care plan.    Goals Addressed            This Visit's Progress   . General - Client will not be readmitted within 30 days (C-SNP)         Plan:  Send successful outreach letter with a copy of their individualized care plan and Send individual care plan to provider  Chronic care management coordinator will outreach in 1-2 months  Will refer to:  Pharmacy for medication management per request of inpatient team      Dudley Major RN, [facility], CDE Chronic Care Management Coordinator [facility] Care Management [phone]  .

## 2018-08-09 ENCOUNTER — Telehealth: Payer: Self-pay | Admitting: Pharmacist

## 2018-08-09 DIAGNOSIS — I130 Hypertensive heart and chronic kidney disease with heart failure and stage 1 through stage 4 chronic kidney disease, or unspecified chronic kidney disease: Secondary | ICD-10-CM | POA: Diagnosis not present

## 2018-08-09 DIAGNOSIS — J9601 Acute respiratory failure with hypoxia: Secondary | ICD-10-CM | POA: Diagnosis not present

## 2018-08-09 DIAGNOSIS — N183 Chronic kidney disease, stage 3 (moderate): Secondary | ICD-10-CM | POA: Diagnosis not present

## 2018-08-09 DIAGNOSIS — I5031 Acute diastolic (congestive) heart failure: Secondary | ICD-10-CM | POA: Diagnosis not present

## 2018-08-09 DIAGNOSIS — E1122 Type 2 diabetes mellitus with diabetic chronic kidney disease: Secondary | ICD-10-CM | POA: Diagnosis not present

## 2018-08-09 NOTE — Telephone Encounter (Signed)
-----   Message from Sharalyn Ink sent at 08/08/2018  2:21 PM EDT ----- Regarding: Referral: Order for Reuben Likes  Referral from Dudley Major, RN  "Please see below request"  Elza Rafter ----- Message ----- From: Yetta Glassman, RN Sent: 08/08/2018   1:56 PM EDT To: Thn Cm Communication Orders Subject: Order for Rottenberg,Montray                            Patient Name: WSFK,CLEXN([id number]) Sex: Male DOB: [birth year]    PCP: Real Cons DIONNE   Center: [facility]   Types of orders made on 08/08/2018: Nursing  Order Date:08/08/2018 Ordering Fran Lowes [[id number]] Encounter Provider:Sandlin, Barbee Cough, RN [[id number]] Authorizi ng Provider: Clide Dales, [place] [[id number]] Department:[facility][[id number]]  Order Specific Information Order: Comm to Pharmacy [Custom: [id number]]  Order #: [id number] Qty: 1   Priority: Routine  Class: Clinic Performed   Comment:CSNP client recently hospitalized for acute PE.  Please call for            medication management and polypharmacy      Reason for Consult -> Medication Man agement          -> Polypharmacy       Priority: Routine  Class: Clinic Performed   Comment:CSNP client recently hospitalized for acute PE.  Please call for            medication management and polypharmacy      Reason for Consult -> Medication Management          -> Polypharmacy

## 2018-08-09 NOTE — Patient Outreach (Signed)
[facility]) Care Management  [facility]   08/09/2018  Todd Chambers 16-Oct-1950 [id number]  Reason for referral: Medication Management  Referral source: [facility] RN Current insurance:[employer]  Outreach:  Successful telephone call with patient.  HIPAA identifiers verified.   Subjective:  Patient is a 68 year old male with multiple medical conditions including but not limited to:  CHF, CKD stage 3, type 2 diabetes with secondary polyneuropathy, obesity and  Hypertension.  He was recently hospitalized for PE and CHF exacerbation.    Objective: Lab Results  Component Value Date   CREATININE 1.79 (H) 08/07/2018   CREATININE 1.57 (H) 08/06/2018   CREATININE 1.55 (H) 08/05/2018    Lab Results  Component Value Date   HGBA1C 7.5 (H) 08/05/2018    Lipid Panel  No results found for: CHOL, TRIG, HDL, CHOLHDL, VLDL, LDLCALC, LDLDIRECT  BP Readings from Last 3 Encounters:  08/07/18 (!) 147/76  07/06/17 112/62  01/16/14 118/65    Allergies  Allergen Reactions  . Pioglitazone Other (See Comments)  . Statins Other (See Comments)  . Oxycodone-Acetaminophen Nausea Only  . Penicillins Rash    Did it involve swelling of the face/tongue/throat, SOB, or low BP? No Did it involve sudden or severe rash/hives, skin peeling, or any reaction on the inside of your mouth or nose? No Did you need to seek medical attention at a hospital or doctor's office? No When did it last happen?Childhood If all above answers are "NO", may proceed with cephalosporin use.    Medications Reviewed Today    Reviewed by Lenoria Farrier, CPhT (Pharmacy Technician) on 08/05/18 at 1018  Med List Status: Complete  Medication Order Taking? Sig Documenting Provider Last Dose Status Informant  allopurinol (ZYLOPRIM) 300 MG tablet [id number] Yes Take 300 mg by mouth daily. [provider] 08/04/2018 Unknown time Active Child        Discontinued 08/05/18 1015 (Patient  Preference)   aspirin 81 MG chewable tablet [id number] Yes Chew by mouth daily. [provider] 08/04/2018 Unknown time Active Child       Patient not taking:       Discontinued 08/05/18 1015 (Patient Preference)   Cholecalciferol (VITAMIN D PO) [id number] Yes Take by mouth. [provider] 08/04/2018 Unknown time Active Child        Discontinued 08/05/18 1016 (Patient Preference)   glipiZIDE (GLUCOTROL XL) 10 MG 24 hr tablet [id number] Yes Take 10 mg by mouth daily with breakfast. [provider] 08/04/2018 Unknown time Active Child        Discontinued 08/05/18 1005 (Discontinued by provider)   labetalol (NORMODYNE) 200 MG tablet [id number] Yes Take 200 mg by mouth 2 (two) times daily. [provider] 08/04/2018 Unknown time Active Child  levothyroxine (SYNTHROID, LEVOTHROID) 100 MCG tablet [id number] Yes Take 100 mcg by mouth daily before breakfast. [provider] 08/04/2018 1100 Active Child  lovastatin (MEVACOR) 40 MG tablet [id number] Yes Take 40 mg by mouth at bedtime. [provider] 08/04/2018 Unknown time Active Child  NOVOLIN 70/30 RELION (70-30) 100 UNIT/ML injection [id number] Yes Inject 45.5-95 Units into the skin See admin instructions. Take 95 units in the morning and 45.5 units in the evening [provider] 08/04/2018 Unknown time Active Child  Omega-3 Fatty Acids (FISH OIL PO) [id number] Yes Take by mouth. [provider] 08/04/2018 Unknown time Active Child       Patient not taking:       Discontinued 08/05/18 1015 (Patient Preference)   quinapril (  ACCUPRIL) 20 MG tablet [id number] Yes Take 20 mg by mouth daily.  [provider] 08/04/2018 Unknown time Active Child  tadalafil (CIALIS) 5 MG tablet [id number] Yes Take 5 mg by mouth daily. [provider] 08/04/2018 Unknown time Active Child  tiZANidine (ZANAFLEX) 4 MG tablet [id number] Yes Take 4 mg by mouth every 6 (six) hours as needed for muscle spasms.  [provider] Past Month prn Active Child  torsemide (DEMADEX) 20 MG tablet [id number] Yes Take 10 mg by mouth every Monday, Wednesday, and Friday.  [provider] 08/04/2018 Unknown time Active Child           ASSESSMENT:  Patient's medications were reviewed via telephone with him and the physical therapist that was present in his home.  Date Discharged from Hospital: 08/07/2018 Date Medication Reconciliation Performed: 08/09/2018  Medications:  New at Discharge: . Eliquis   Adjustments at Discharge: . n/a  Discontinued at Discharge:   n/a   Patient was recently discharged from hospital and all medications have been reviewed.  Drugs sorted by system:  Cardiovascular: Aspirin, Eliquis, Labetalol, Lovastatin, Omega 3 Fatty Acid (did not have in his possession), Quinapril, Torsemide,  Pulmonary/Allergy:  Endocrine: Glipizide, Levothyroxine, Novolin 70/30,   Renal: Allopurinol  Pain: Tizanidine  Genitourinary: Tadalafil   Vitamins/Minerals/Supplements: Cholecalciferol,   Medication Review Findings:  . Patient was not very knowledgeable about his medications.  The physical therapist in his home was was very helpful and said he was requesting an in-home nurse to come to see the patient.     Medication Assistance Findings:  (Not evaluated)   Plan: Will follow up with the patient in 5-7 business days to evaluate for medication assistance (especially with Eliquis).

## 2018-08-10 DIAGNOSIS — E039 Hypothyroidism, unspecified: Secondary | ICD-10-CM | POA: Diagnosis not present

## 2018-08-10 DIAGNOSIS — E119 Type 2 diabetes mellitus without complications: Secondary | ICD-10-CM | POA: Diagnosis not present

## 2018-08-10 DIAGNOSIS — E7800 Pure hypercholesterolemia, unspecified: Secondary | ICD-10-CM | POA: Diagnosis not present

## 2018-08-18 ENCOUNTER — Other Ambulatory Visit: Payer: Self-pay | Admitting: Pharmacist

## 2018-08-18 NOTE — Patient Outreach (Signed)
[facility]) Care Management  [place]   08/18/2018  Todd Chambers 15-Nov-1950 [id number]  Reason for referral: Medication Assistance, Medication Review  Referral source: [facility] RN Current insurance: [employer] C-SNP  PMHx includes but not limited to:   CKD stage III, type 2 diabetes, hypertension, BPH, OSA, Hypothyroidism, and morbid obesity.  Outreach:  Successful telephone call with patient.  HIPAA identifiers verified.   Subjective:  Patient reports using (nothing) as an adherence strategy.  Does the patient ever forget to take medication?  yes Does the patient have problems obtaining medications due to transportation?   no Does the patient have problems obtaining medications due to cost?  no  Does the patient feel t medications prescribed are effective?  Yes- Does the patient ever experience any side effects to the medications prescribed?  no  Does the patient measure his/her own blood glucose at home?  Yes- 129 mg/dl this morning  Does the patient measure his/her own blood pressure at home? No   Objective: Lab Results  Component Value Date   CREATININE 1.79 (H) 08/07/2018   CREATININE 1.57 (H) 08/06/2018   CREATININE 1.55 (H) 08/05/2018    Lab Results  Component Value Date   HGBA1C 7.5 (H) 08/05/2018    Lipid Panel  No results found for: CHOL, TRIG, HDL, CHOLHDL, VLDL, LDLCALC, LDLDIRECT  BP Readings from Last 3 Encounters:  08/07/18 (!) 147/76  07/06/17 112/62  01/16/14 118/65    Allergies  Allergen Reactions  . Pioglitazone Other (See Comments)  . Statins Other (See Comments)  . Oxycodone-Acetaminophen Nausea Only  . Penicillins Rash    Did it involve swelling of the face/tongue/throat, SOB, or low BP? No Did it involve sudden or severe rash/hives, skin peeling, or any reaction on the inside of your mouth or nose? No Did you need to seek medical attention at a hospital or doctor's office? No When did it last  happen?Childhood If all above answers are "NO", may proceed with cephalosporin use.    Medications Reviewed Today    Reviewed by Todd Chambers, [facility] (Pharmacist) on 08/09/18 at 1100  Med List Status: <None>  Medication Order Taking? Sig Documenting Provider Last Dose Status Informant  allopurinol (ZYLOPRIM) 300 MG tablet [id number] Yes Take 300 mg by mouth daily. [provider] Taking Active Child  aspirin 81 MG chewable tablet [id number] Yes Chew by mouth daily. [provider] Taking Active Child  Cholecalciferol (VITAMIN D-1000 MAX ST) 25 MCG (1000 UT) tablet [id number] Yes Take 1 capsule by mouth daily. [provider] Taking Active   Eliquis DVT/PE Starter Pack (ELIQUIS STARTER PACK) 5 MG TABS [id number] Yes Take as directed on package: start with two-'5mg'$  tablets twice daily for 7 days. On day 8, switch to one-'5mg'$  tablet twice daily. Todd Girt, DO Taking Active   glipiZIDE (GLUCOTROL XL) 10 MG 24 hr tablet [id number] Yes Take 10 mg by mouth daily with breakfast. [provider] Taking Active Child  labetalol (NORMODYNE) 200 MG tablet [id number] Yes Take 200 mg by mouth 2 (two) times daily. [provider] Taking Active Child  levothyroxine (SYNTHROID, LEVOTHROID) 100 MCG tablet [id number] Yes Take 100 mcg by mouth daily before breakfast. [provider] Taking Active Child  lovastatin (MEVACOR) 40 MG tablet [id number] Yes Take 40 mg by mouth at bedtime. [provider] Taking Active Child  NOVOLIN 70/30 RELION (70-30) 100 UNIT/ML injection [id number] Yes Inject 45.5-95 Units into the skin See admin instructions. Take 95  units in the morning and 45.5 units in the evening [provider] Taking Active Child  Omega-3 Fatty Acids (FISH OIL PO) [id number] No Take by mouth. [provider] Not Taking Active Child           Med Note Arnette Schaumann Aug 09, 2018 10:47 AM) Did not have  quinapril (ACCUPRIL) 20 MG  tablet [id number] Yes Take 20 mg by mouth daily.  [provider] Taking Active Child  tadalafil (CIALIS) 5 MG tablet [id number] Yes Take 5 mg by mouth daily. [provider] Taking Active Child  tiZANidine (ZANAFLEX) 4 MG tablet [id number] Yes Take 4 mg by mouth every 6 (six) hours as needed for muscle spasms. [provider] Taking Active Child  torsemide (DEMADEX) 20 MG tablet [id number] Yes Take 10 mg by mouth every Monday, Wednesday, and Friday.  [provider] Taking Active Child          Assessment:  Drugs sorted by system:  Cardiovascular: Aspirin, Eliquis, Lovastatin, Quinapril , Torsemide, Lovastatin,   Endocrine: Glipizide, Novolin 70/30, Levothyroxine  Renal: Allopurinol   Pain: Tizanidine  Vitamins/Minerals/Supplements: Cholecalciferol, Omega Fatty Acids  Miscellaneous: Latanoprost  Medication Review Findings:  . Adherence- patient seemed to struggle when reviewing his medications over the phone. . An account was set up with [facility] for the patient. His provider's office will be called with a request to send:  Quinapril, torsemide, tizanidine, glipizide, allopurinol and levothyroxine to [facility] (These medications are tier 1 and will not have a copay on the patient's insurance) . Called patient's provider's office and requested new prescriptions be sent to [facility].   Medication Adherence Findings:  Adherence Review  '[]'$  Excellent (no doses missed/week)     '[]'$  Good (no more than 1 dose missed/week)     '[x]'$  Partial (2-3 doses missed/week) '[]'$  Poor (>3 doses missed/week)  Patient with fair understanding of regimen and fair of indications.    Potential of compliance: fair  Medication Assistance Findings:  Medication assistance needs identified.   Extra Help:  Not eligible for Extra Help Low Income Subsidy based on reported income and assets  Patient Assistance Programs: Eliquis made by  [place] requirement met: Yes o Out-of-pocket prescription expenditure met:   No - Patient has not met application requirements to apply for this patient assistance program at this time.     Additional medication assistance options reviewed with patient as warranted:  Insurance OTC catalogue--Called [place] office to inquire about the new Gift card that was requested for the patient so he can purchase OTC products. The representative said the patient has $30 left on his card and that a new card was sent out to the patient on 08/14/2018.    Patient was encouraged to call [employer] once his card was received to help with getting it activated.  Plan: Will route note to PCP. , Will follow-up in 1 week    Todd Chambers, PharmD, [place] Clinical Pharmacist [phone]

## 2018-08-23 ENCOUNTER — Ambulatory Visit: Payer: Self-pay

## 2018-08-23 DIAGNOSIS — I5031 Acute diastolic (congestive) heart failure: Secondary | ICD-10-CM | POA: Diagnosis not present

## 2018-08-23 DIAGNOSIS — J9601 Acute respiratory failure with hypoxia: Secondary | ICD-10-CM | POA: Diagnosis not present

## 2018-08-23 DIAGNOSIS — E1122 Type 2 diabetes mellitus with diabetic chronic kidney disease: Secondary | ICD-10-CM | POA: Diagnosis not present

## 2018-08-23 DIAGNOSIS — N183 Chronic kidney disease, stage 3 (moderate): Secondary | ICD-10-CM | POA: Diagnosis not present

## 2018-08-23 DIAGNOSIS — I130 Hypertensive heart and chronic kidney disease with heart failure and stage 1 through stage 4 chronic kidney disease, or unspecified chronic kidney disease: Secondary | ICD-10-CM | POA: Diagnosis not present

## 2018-09-04 ENCOUNTER — Ambulatory Visit: Payer: Self-pay

## 2018-09-06 ENCOUNTER — Other Ambulatory Visit: Payer: Self-pay | Admitting: Pharmacist

## 2018-09-06 ENCOUNTER — Ambulatory Visit: Payer: Self-pay | Admitting: Pharmacist

## 2018-09-06 NOTE — Patient Outreach (Addendum)
[facility]) Care Management  09/06/2018  Todd Chambers [birth year] [id number]   Patient was called to follow up on medications being filled and sent to [facility]. Unfortunately, patient did not answer the phone. HIPAA compliant message was left on his voicemail.  Plan: Call patient back in 5-7 business days. Send unsuccessful contact letter.   Todd Chambers, PharmD, BCACP [facility] Clinical Pharmacist [phone]

## 2018-09-11 ENCOUNTER — Other Ambulatory Visit: Payer: Self-pay

## 2018-09-11 NOTE — Patient Outreach (Signed)
  [facility]) Care Management Chronic Special Needs Program  09/11/2018  Name: Cyruss Stauffer DOB: 01-Jul-1950  MRN: [id number]  Mr. Jacaleb Puhr is enrolled in a chronic special needs plan for Diabetes. Client called with no answer No answer and HIPAA compliant message left. Plan for 2nd outreach call in one week Chronic care management coordinator will attempt outreach in 1 week.   Dudley Major RN, Maximiano Coss, CDE Chronic Care Management Coordinator [facility] Care Management [phone]

## 2018-09-15 ENCOUNTER — Other Ambulatory Visit: Payer: Self-pay | Admitting: Pharmacist

## 2018-09-15 NOTE — Patient Outreach (Addendum)
[facility]) Care Management  [place]   09/15/2018  Anwar Sakata 10-30-1950 [id number]  Reason for referral: Medication Review, Medication Adherence  Referral source: [place] Management RN with [employer] C-SNP Current insurance: [employer] C-SNP  PMHx includes but not limited to:   CHF, CKD stage III, type 2 diabetes, hypothyroidism with secondary neuropathy, morbid obesity, and OSA.  Outreach:  Successful telephone call with patient.  HIPAA identifiers verified.   Subjective:  Patient does not [place] an adherence strategy.  Does the patient ever forget to take medication?  no Does the patient have problems obtaining medications due to transportation?   no Does the patient have problems obtaining medications due to cost?  no  Does the patient feel that medications prescribed are effective?  yes Does the patient ever experience any side effects to the medications prescribed?  no  Does the patient measure his/her own blood glucose at home?  Yes Does the patient measure his/her own blood pressure at home? No   Objective: Lab Results  Component Value Date   CREATININE 1.79 (H) 08/07/2018   CREATININE 1.57 (H) 08/06/2018   CREATININE 1.55 (H) 08/05/2018    Lab Results  Component Value Date   HGBA1C 7.5 (H) 08/05/2018    Lipid Panel  No results found for: CHOL, TRIG, HDL, CHOLHDL, VLDL, LDLCALC, LDLDIRECT  BP Readings from Last 3 Encounters:  08/07/18 (!) 147/76  07/06/17 112/62  01/16/14 118/65    Allergies  Allergen Reactions  . Pioglitazone Other (See Comments)    Patient did not remember the reaction  . Statins Other (See Comments)    Patient did not remember the reaction  . Oxycodone-Acetaminophen Nausea Only  . Penicillins Rash    Did it involve swelling of the face/tongue/throat, SOB, or low BP? No Did it involve sudden or severe rash/hives, skin peeling, or any reaction on the inside of your mouth or nose?  No Did you need to seek medical attention at a hospital or doctor's office? No When did it last happen?Childhood If all above answers are "NO", may proceed with cephalosporin use.    Medications Reviewed Today    Reviewed by Elayne Guerin, [facility] (Pharmacist) on 09/15/18 at 1153  Med List Status: <None>  Medication Order Taking? Sig Documenting Provider Last Dose Status Informant  allopurinol (ZYLOPRIM) 300 MG tablet [id number] Yes Take 300 mg by mouth daily. [provider] Taking Active Child  apixaban (ELIQUIS) 5 MG TABS tablet [id number] Yes Take 1 tablet by mouth 2 (two) times daily. [provider] Taking Active   aspirin 81 MG chewable tablet [id number] Yes Chew by mouth daily. [provider] Taking Active Child  Cholecalciferol (VITAMIN D-1000 MAX ST) 25 MCG (1000 UT) tablet [id number] Yes Take 1 capsule by mouth daily. [provider] Taking Active   glipiZIDE (GLUCOTROL XL) 10 MG 24 hr tablet [id number] Yes Take 10 mg by mouth daily with breakfast. [provider] Taking Active Child  labetalol (NORMODYNE) 200 MG tablet [id number] Yes Take 1 tablet by mouth 2 (two) times a day. [provider] Taking Active   latanoprost (XALATAN) 0.005 % ophthalmic solution [id number] Yes Place 1 drop into both eyes at bedtime. [provider] Taking Active   levothyroxine (SYNTHROID, LEVOTHROID) 100 MCG tablet [id number] Yes Take 100 mcg by mouth daily before breakfast. [provider] Taking Active Child  lovastatin (MEVACOR) 40 MG tablet [id number] Yes Take 40 mg by mouth at bedtime. [provider] Taking Active Child  NOVOLIN 70/30 RELION (70-30) 100 UNIT/ML injection [id number] Yes Inject 45.5-95 Units into the skin See admin instructions. Take 95 units in the morning and 45.5 units in the evening [provider] Taking Active Child  Omega-3 Fatty Acids (FISH OIL PO) [id number] Yes Take by mouth. [provider] Taking Active Child           Med Note Luciana Axe, Angelia Mould   Fri Sep 15, 2018 11:50 AM)    ONE TOUCH ULTRA TEST test strip [id number] Yes USE TO TEST BLOOD SUGAR ONCE DAILY [provider] Taking Active   quinapril (ACCUPRIL) 20 MG tablet [id number] Yes Take 20 mg by mouth daily.  [provider] Taking Active Child  tadalafil (CIALIS) 5 MG tablet [id number] Yes Take 5 mg by mouth daily. [provider] Taking Active Child  tiZANidine (ZANAFLEX) 4 MG tablet [id number] Yes Take 4 mg by mouth every 6 (six) hours as needed for muscle spasms. [provider] Taking Active Child  torsemide (DEMADEX) 20 MG tablet [id number] Yes Take 1/2 tablet on Mondays and Fridays. [provider] Taking Active Child          Assessment:  Drugs sorted by system:  Cardiovascular: Aspirin, Eliquis, Lovastatin, Quinapril, torsemide, labetalol  Endocrine: Glipizide, Levothyroxine, Novolin 70/30  GI: Lactulose Solution  Renal: Allopurinol  Pain: Tizanidine  Genitourinary: Tadalafil  Vitamins/Minerals/Supplements: Omega 3 Fatty Acid, Cholecalciferol  Miscellaneous: Latanoprost  Medication Review Findings:  . HgA1c 7.5 % -Novolin 70/30, glipizide goal <7% per provider . LDL 118 09/2016-Lovastatin May need lipid levels drawn goal <70 per provider o Patient may need to be switched to a more potent statin to reach his LDL goal. Although statins are listed as an allergy, patient did not remember if he is actually allergic. New lipid levels may also need to be drawn. . Scr 1.79  Medication Adherence Findings: Adherence Review  '[x]'$  Excellent (no doses missed/week)     '[]'$  Good (no more than 1 dose missed/week)     '[]'$  Partial (2-3 doses missed/week) '[]'$  Poor (>3 doses missed/week)  Patient with fair understanding of regimen and fair understanding of indications.    Potential of compliance: good  Medication Assistance Findings:  Medication  assistance needs identified.   Extra Help:  Not eligible for Extra Help Low Income Subsidy based on reported income and assets  Patient Assistance Programs: Eliquis made by [place] requirement met: Yes o Out-of-pocket prescription expenditure met:   No ( ) - Patient has not met application requirements to apply for this patient assistance program at this time.    Patient mentioned wanting help with the price of tadalafil.  Unfortunately, tadalafil is an excluded medication and is not covered by Medicare Part D per CMS.  Patient was offered a [place] coupon. He said he thinks he is already using a coupon.   Since tadalafil is excluded from Part D, there are no other options.  Plan: . Will follow-up in 3 months. --check in on TROOP. Marland Kitchen Route note to PCP. Marland Kitchen Continue to encourage patient to follow a low carb diet (per his provider's notes).  Elayne Guerin, PharmD, [place] Clinical Pharmacist [phone]

## 2018-09-18 ENCOUNTER — Other Ambulatory Visit: Payer: Self-pay

## 2018-09-18 NOTE — Patient Outreach (Signed)
  [facility]) Care Management Chronic Special Needs Program  09/18/2018  Name: Todd Chambers DOB: 06-Jun-1950  MRN: [id number]  Mr. Gearold Pier is enrolled in a chronic special needs plan for Diabetes. Client called with no answer No answer and HIPAA compliant message left. Plan for 3rd outreach call in 3-4 days Chronic care management coordinator will attempt outreach in 3-4 days .   Dudley Major RN, Maximiano Coss, CDE Chronic Care Management Coordinator [facility] Care Management [phone]

## 2018-09-21 ENCOUNTER — Other Ambulatory Visit: Payer: Self-pay

## 2018-09-21 NOTE — Patient Outreach (Signed)
  [facility]) Care Management Chronic Special Needs Program  09/21/2018  Name: Todd Chambers DOB: 1950-11-22  MRN: [id number]  [name] is enrolled in a chronic special needs plan for Diabetes. Client called with no answer No answer and HIPAA compliant message left. 3rd attempt Plan to update care plan and send to client if client does not respond to call in 2-3 days Chronic care management coordinator will attempt outreach in 6 Months.   Dudley Major RN, Maximiano Coss, CDE Chronic Care Management Coordinator [facility] Care Management [phone]

## 2018-09-27 ENCOUNTER — Other Ambulatory Visit: Payer: Self-pay

## 2018-09-27 NOTE — Patient Outreach (Signed)
  [facility]) Care Management Chronic Special Needs Program  09/27/2018  Name: Todd Chambers DOB: Oct 11, 1950  MRN: [id number]  Mr. Todd Chambers is enrolled in a chronic special needs plan for Diabetes. Client has not responded to three outreach attempts. Goals Addressed            This Visit's Progress   .  Acknowledge receipt of Advanced Directive package   On track   . Advanced Care Planning complete by next 9 months      . Client verbalize knowledge of Heart Failure diease self management skills by next 6 months   On track   . COMPLETED: Client will report abillity to obtain Medications within next 3 months       [employer] underwriter completed     . Client will use Assistive Devices as needed and verbalize understanding of device use   On track   . Client will verbalize knowledge of self management of Hypertension as evidences by BP reading of 140/90 or less; or as defined by provider   On track   . Decrease inpatient admissions/ readmissions with in the next year   Not on track   . Decrease inpatient diabetes admissions/readmissions with in the year   On track   . Decrease the use of hospital emergency department related to diabetes within the next year    On track   . COMPLETED: General - Client will not be readmitted within 30 days (C-SNP)       No readmission in last 30 days    . HEMOGLOBIN A1C < 7.0       Diabetes self management actions:  Glucose monitoring per provider recommendations  Perform Quality checks on blood meter  Eat Healthy  Check feet daily  Visit provider every 3-6 months as directed  Hbg A1C level every 3-6 months.  Eye Exam yearly    . Maintain timely refills of diabetic medication as prescribed within the year .   On track   . Obtain annual  Lipid Profile, LDL-C   On track   . Obtain Annual Eye (retinal)  Exam    On track   . Obtain Annual Foot Exam   On track   . Obtain annual screen for micro albuminuria (urine) ,  nephropathy (kidney problems)   On track   . Obtain Hemoglobin A1C at least 2 times per year   On track   . Visit Primary Care Provider or Endocrinologist at least 2 times per year    On track      The client's individualized care plan was updated based upon the completed health risk assessment and review of medical records Plan:   . Send unsuccessful outreach letter with a copy of updated individualized care plan to client . Send individualized care plan to provider . Pharmacy did complete call with client for assistance  Chronic care management coordinator will attempt outreach in 6 Months.   Dudley Major RN, Todd Chambers, CDE Chronic Care Management Coordinator [facility] Care Management [phone]

## 2018-10-11 DIAGNOSIS — H2513 Age-related nuclear cataract, bilateral: Secondary | ICD-10-CM | POA: Diagnosis not present

## 2018-10-11 DIAGNOSIS — H401131 Primary open-angle glaucoma, bilateral, mild stage: Secondary | ICD-10-CM | POA: Diagnosis not present

## 2018-10-11 DIAGNOSIS — E119 Type 2 diabetes mellitus without complications: Secondary | ICD-10-CM | POA: Diagnosis not present

## 2018-10-11 DIAGNOSIS — Z794 Long term (current) use of insulin: Secondary | ICD-10-CM | POA: Diagnosis not present

## 2018-11-06 DIAGNOSIS — N183 Chronic kidney disease, stage 3 (moderate): Secondary | ICD-10-CM | POA: Diagnosis not present

## 2018-11-06 DIAGNOSIS — E559 Vitamin D deficiency, unspecified: Secondary | ICD-10-CM | POA: Diagnosis not present

## 2018-11-06 DIAGNOSIS — I10 Essential (primary) hypertension: Secondary | ICD-10-CM | POA: Diagnosis not present

## 2018-11-08 DIAGNOSIS — E119 Type 2 diabetes mellitus without complications: Secondary | ICD-10-CM | POA: Diagnosis not present

## 2018-11-08 DIAGNOSIS — N183 Chronic kidney disease, stage 3 (moderate): Secondary | ICD-10-CM | POA: Diagnosis not present

## 2018-11-08 DIAGNOSIS — I10 Essential (primary) hypertension: Secondary | ICD-10-CM | POA: Diagnosis not present

## 2018-11-08 DIAGNOSIS — N390 Urinary tract infection, site not specified: Secondary | ICD-10-CM | POA: Diagnosis not present

## 2018-11-15 DIAGNOSIS — N400 Enlarged prostate without lower urinary tract symptoms: Secondary | ICD-10-CM | POA: Diagnosis not present

## 2018-11-15 DIAGNOSIS — Z209 Contact with and (suspected) exposure to unspecified communicable disease: Secondary | ICD-10-CM | POA: Diagnosis not present

## 2018-11-15 DIAGNOSIS — J9601 Acute respiratory failure with hypoxia: Secondary | ICD-10-CM | POA: Diagnosis not present

## 2018-11-15 DIAGNOSIS — E662 Morbid (severe) obesity with alveolar hypoventilation: Secondary | ICD-10-CM | POA: Diagnosis not present

## 2018-11-15 DIAGNOSIS — Z7901 Long term (current) use of anticoagulants: Secondary | ICD-10-CM | POA: Diagnosis not present

## 2018-11-15 DIAGNOSIS — K5900 Constipation, unspecified: Secondary | ICD-10-CM | POA: Diagnosis not present

## 2018-11-15 DIAGNOSIS — R0602 Shortness of breath: Secondary | ICD-10-CM | POA: Diagnosis not present

## 2018-11-15 DIAGNOSIS — U071 COVID-19: Secondary | ICD-10-CM | POA: Diagnosis not present

## 2018-11-15 DIAGNOSIS — E1165 Type 2 diabetes mellitus with hyperglycemia: Secondary | ICD-10-CM | POA: Diagnosis not present

## 2018-11-15 DIAGNOSIS — N401 Enlarged prostate with lower urinary tract symptoms: Secondary | ICD-10-CM | POA: Diagnosis not present

## 2018-11-15 DIAGNOSIS — Z86711 Personal history of pulmonary embolism: Secondary | ICD-10-CM | POA: Diagnosis not present

## 2018-11-15 DIAGNOSIS — R1084 Generalized abdominal pain: Secondary | ICD-10-CM | POA: Diagnosis not present

## 2018-11-15 DIAGNOSIS — N3000 Acute cystitis without hematuria: Secondary | ICD-10-CM | POA: Diagnosis not present

## 2018-11-15 DIAGNOSIS — I10 Essential (primary) hypertension: Secondary | ICD-10-CM | POA: Diagnosis not present

## 2018-11-15 DIAGNOSIS — N189 Chronic kidney disease, unspecified: Secondary | ICD-10-CM | POA: Diagnosis not present

## 2018-11-15 DIAGNOSIS — Z6841 Body Mass Index (BMI) 40.0 and over, adult: Secondary | ICD-10-CM | POA: Diagnosis not present

## 2018-11-15 DIAGNOSIS — E039 Hypothyroidism, unspecified: Secondary | ICD-10-CM | POA: Diagnosis not present

## 2018-11-15 DIAGNOSIS — R5381 Other malaise: Secondary | ICD-10-CM | POA: Diagnosis not present

## 2018-11-15 DIAGNOSIS — N179 Acute kidney failure, unspecified: Secondary | ICD-10-CM | POA: Diagnosis not present

## 2018-11-15 DIAGNOSIS — M791 Myalgia, unspecified site: Secondary | ICD-10-CM | POA: Diagnosis not present

## 2018-11-15 DIAGNOSIS — N138 Other obstructive and reflux uropathy: Secondary | ICD-10-CM | POA: Diagnosis not present

## 2018-11-15 DIAGNOSIS — Z79899 Other long term (current) drug therapy: Secondary | ICD-10-CM | POA: Diagnosis not present

## 2018-11-15 DIAGNOSIS — E1140 Type 2 diabetes mellitus with diabetic neuropathy, unspecified: Secondary | ICD-10-CM | POA: Diagnosis not present

## 2018-11-15 DIAGNOSIS — E11649 Type 2 diabetes mellitus with hypoglycemia without coma: Secondary | ICD-10-CM | POA: Diagnosis not present

## 2018-11-15 DIAGNOSIS — F17220 Nicotine dependence, chewing tobacco, uncomplicated: Secondary | ICD-10-CM | POA: Diagnosis not present

## 2018-11-15 DIAGNOSIS — R05 Cough: Secondary | ICD-10-CM | POA: Diagnosis not present

## 2018-11-15 DIAGNOSIS — R509 Fever, unspecified: Secondary | ICD-10-CM | POA: Diagnosis not present

## 2018-11-15 DIAGNOSIS — J9611 Chronic respiratory failure with hypoxia: Secondary | ICD-10-CM | POA: Diagnosis not present

## 2018-11-15 DIAGNOSIS — N390 Urinary tract infection, site not specified: Secondary | ICD-10-CM | POA: Diagnosis not present

## 2018-11-15 DIAGNOSIS — I509 Heart failure, unspecified: Secondary | ICD-10-CM | POA: Diagnosis not present

## 2018-11-15 DIAGNOSIS — J984 Other disorders of lung: Secondary | ICD-10-CM | POA: Diagnosis not present

## 2018-11-15 DIAGNOSIS — E1136 Type 2 diabetes mellitus with diabetic cataract: Secondary | ICD-10-CM | POA: Diagnosis not present

## 2018-11-15 DIAGNOSIS — R0902 Hypoxemia: Secondary | ICD-10-CM | POA: Diagnosis not present

## 2018-11-15 DIAGNOSIS — Z20828 Contact with and (suspected) exposure to other viral communicable diseases: Secondary | ICD-10-CM | POA: Diagnosis not present

## 2018-11-15 DIAGNOSIS — Z794 Long term (current) use of insulin: Secondary | ICD-10-CM | POA: Diagnosis not present

## 2018-11-15 DIAGNOSIS — K7581 Nonalcoholic steatohepatitis (NASH): Secondary | ICD-10-CM | POA: Diagnosis not present

## 2018-11-16 DIAGNOSIS — N390 Urinary tract infection, site not specified: Secondary | ICD-10-CM | POA: Diagnosis not present

## 2018-11-16 DIAGNOSIS — E11649 Type 2 diabetes mellitus with hypoglycemia without coma: Secondary | ICD-10-CM | POA: Diagnosis not present

## 2018-11-16 DIAGNOSIS — E039 Hypothyroidism, unspecified: Secondary | ICD-10-CM | POA: Diagnosis not present

## 2018-11-16 DIAGNOSIS — J9601 Acute respiratory failure with hypoxia: Secondary | ICD-10-CM | POA: Diagnosis not present

## 2018-11-17 ENCOUNTER — Other Ambulatory Visit: Payer: Self-pay

## 2018-11-17 DIAGNOSIS — N3000 Acute cystitis without hematuria: Secondary | ICD-10-CM | POA: Diagnosis not present

## 2018-11-17 DIAGNOSIS — J9601 Acute respiratory failure with hypoxia: Secondary | ICD-10-CM | POA: Diagnosis not present

## 2018-11-17 DIAGNOSIS — E1165 Type 2 diabetes mellitus with hyperglycemia: Secondary | ICD-10-CM | POA: Diagnosis not present

## 2018-11-17 DIAGNOSIS — U071 COVID-19: Secondary | ICD-10-CM | POA: Diagnosis not present

## 2018-11-17 MED ORDER — POLIBAR PO
20.00 | ORAL | Status: DC
Start: 2018-11-21 — End: 2018-11-17

## 2018-11-17 MED ORDER — ALUMINUM-MAGNESIUM-SIMETHICONE 200-200-20 MG/5ML PO SUSP
30.00 | ORAL | Status: DC
Start: ? — End: 2018-11-17

## 2018-11-17 MED ORDER — GENERIC EXTERNAL MEDICATION
1.00 | Status: DC
Start: 2018-11-17 — End: 2018-11-17

## 2018-11-17 MED ORDER — BISACODYL 5 MG PO TBEC
10.00 | DELAYED_RELEASE_TABLET | ORAL | Status: DC
Start: ? — End: 2018-11-17

## 2018-11-17 MED ORDER — CLONIDINE HCL 0.1 MG PO TABS
.10 | ORAL_TABLET | ORAL | Status: DC
Start: ? — End: 2018-11-17

## 2018-11-17 MED ORDER — ASPIRIN EC 81 MG PO TBEC
81.00 | DELAYED_RELEASE_TABLET | ORAL | Status: DC
Start: 2018-11-21 — End: 2018-11-17

## 2018-11-17 MED ORDER — DIHYDROTACHYSTEROL 0.125 MG PO TABS
10.00 | ORAL_TABLET | ORAL | Status: DC
Start: 2018-11-20 — End: 2018-11-17

## 2018-11-17 MED ORDER — DURACLON EP
1000.00 | EPIDURAL | Status: DC
Start: 2018-11-21 — End: 2018-11-17

## 2018-11-17 MED ORDER — COMPOUND W FREEZE OFF EX AERO
3.00 | INHALATION_SPRAY | CUTANEOUS | Status: DC
Start: 2018-11-20 — End: 2018-11-17

## 2018-11-17 MED ORDER — STRI-DEX MAXIMUM STRENGTH 2 % EX PADS
125.00 | MEDICATED_PAD | CUTANEOUS | Status: DC
Start: ? — End: 2018-11-17

## 2018-11-17 MED ORDER — MESNA 400 MG PO TABS
100.00 | ORAL_TABLET | ORAL | Status: DC
Start: 2018-11-21 — End: 2018-11-17

## 2018-11-17 MED ORDER — Medication
1.00 | Status: DC
Start: 2018-11-20 — End: 2018-11-17

## 2018-11-17 MED ORDER — SORBITOL 70 % PO SOLN
30.00 | ORAL | Status: DC
Start: ? — End: 2018-11-17

## 2018-11-17 MED ORDER — TRAMADOL HCL 50 MG PO TABS
50.00 | ORAL_TABLET | ORAL | Status: DC
Start: ? — End: 2018-11-17

## 2018-11-17 MED ORDER — DOBUTAMINE IN D5W IV
300.00 | INTRAVENOUS | Status: DC
Start: 2018-11-21 — End: 2018-11-17

## 2018-11-17 MED ORDER — GLUCAGON HCL RDNA (DIAGNOSTIC) 1 MG IJ SOLR
1.00 | INTRAMUSCULAR | Status: DC
Start: ? — End: 2018-11-17

## 2018-11-17 MED ORDER — INSULIN GLARGINE 100 UNIT/ML ~~LOC~~ SOLN
25.00 | SUBCUTANEOUS | Status: DC
Start: 2018-11-18 — End: 2018-11-17

## 2018-11-17 MED ORDER — ACETAMINOPHEN 325 MG PO TABS
650.00 | ORAL_TABLET | ORAL | Status: DC
Start: ? — End: 2018-11-17

## 2018-11-17 MED ORDER — ONDANSETRON 4 MG PO TBDP
4.00 | ORAL_TABLET | ORAL | Status: DC
Start: ? — End: 2018-11-17

## 2018-11-17 MED ORDER — GLUCOSE 40 % PO GEL
15.00 | ORAL | Status: DC
Start: ? — End: 2018-11-17

## 2018-11-17 MED ORDER — APIXABAN 5 MG PO TABS
5.00 | ORAL_TABLET | ORAL | Status: DC
Start: 2018-11-20 — End: 2018-11-17

## 2018-11-17 MED ORDER — ZOLPIDEM TARTRATE 5 MG PO TABS
5.00 | ORAL_TABLET | ORAL | Status: DC
Start: ? — End: 2018-11-17

## 2018-11-17 MED ORDER — GUAIFENESIN-DM 100-10 MG/5ML PO SYRP
5.00 | ORAL_SOLUTION | ORAL | Status: DC
Start: ? — End: 2018-11-17

## 2018-11-17 MED ORDER — ONDANSETRON HCL 4 MG/2ML IJ SOLN
4.00 | INTRAMUSCULAR | Status: DC
Start: ? — End: 2018-11-17

## 2018-11-17 NOTE — Patient Outreach (Signed)
  [facility]) Care Management Chronic Special Needs Program    11/17/2018  Name: Todd Chambers, DOB: 09-Nov-1950  MRN: [id number]   Mr. Todd Chambers is enrolled in a chronic special needs plan for Diabetes. Notified today(11/17/18) that client was admitted to [facility] on 11/15/18 with dx of SOB, fever, abd pain and rule out Covid 19 Individualized care plan sent to [facility] hospital for admission  Plan to follow up post discharged.  Peter Garter RN, Jackquline Denmark, CDE Chronic Care Management Coordinator [facility] Care Management [phone]

## 2018-11-18 DIAGNOSIS — J9601 Acute respiratory failure with hypoxia: Secondary | ICD-10-CM | POA: Diagnosis not present

## 2018-11-18 DIAGNOSIS — U071 COVID-19: Secondary | ICD-10-CM | POA: Diagnosis not present

## 2018-11-18 DIAGNOSIS — N3000 Acute cystitis without hematuria: Secondary | ICD-10-CM | POA: Diagnosis not present

## 2018-11-18 DIAGNOSIS — E1165 Type 2 diabetes mellitus with hyperglycemia: Secondary | ICD-10-CM | POA: Diagnosis not present

## 2018-11-19 DIAGNOSIS — U071 COVID-19: Secondary | ICD-10-CM | POA: Diagnosis not present

## 2018-11-19 DIAGNOSIS — N3000 Acute cystitis without hematuria: Secondary | ICD-10-CM | POA: Diagnosis not present

## 2018-11-19 DIAGNOSIS — J9601 Acute respiratory failure with hypoxia: Secondary | ICD-10-CM | POA: Diagnosis not present

## 2018-11-19 DIAGNOSIS — E1165 Type 2 diabetes mellitus with hyperglycemia: Secondary | ICD-10-CM | POA: Diagnosis not present

## 2018-11-20 DIAGNOSIS — I10 Essential (primary) hypertension: Secondary | ICD-10-CM | POA: Diagnosis not present

## 2018-11-20 DIAGNOSIS — J9611 Chronic respiratory failure with hypoxia: Secondary | ICD-10-CM | POA: Diagnosis not present

## 2018-11-20 DIAGNOSIS — U071 COVID-19: Secondary | ICD-10-CM | POA: Diagnosis not present

## 2018-11-20 MED ORDER — INSULIN GLARGINE 100 UNIT/ML ~~LOC~~ SOLN
35.00 | SUBCUTANEOUS | Status: DC
Start: 2018-11-21 — End: 2018-11-20

## 2018-11-20 MED ORDER — PHENYLEPHRINE-GUAIFENESIN 30-400 MG PO CP12
10.00 | ORAL_CAPSULE | ORAL | Status: DC
Start: 2018-11-21 — End: 2018-11-20

## 2018-11-20 MED ORDER — VICON FORTE PO CAPS
17.00 | ORAL_CAPSULE | ORAL | Status: DC
Start: 2018-11-21 — End: 2018-11-20

## 2018-11-20 MED ORDER — TORSEMIDE 10 MG PO TABS
20.00 | ORAL_TABLET | ORAL | Status: DC
Start: 2018-11-21 — End: 2018-11-20

## 2018-11-22 ENCOUNTER — Other Ambulatory Visit: Payer: Self-pay

## 2018-11-22 NOTE — Patient Outreach (Signed)
  [facility]) Care Management Chronic Special Needs Program    11/22/2018  Name: Todd Chambers, DOB: 1950/06/22  MRN: [id number]   Mr. Todd Chambers is enrolled in a chronic special needs plan for Diabetes.   Per Care Everywhere: Client admitted to [facility] on 11/15/18 for COVID-19 viral infection/ acute hypoxic respiratory failure, hypoglycemia due to glipizide, AKI on CKD stage 2. Client discharged to home on 11/20/2018.  Plan: Transition of care per utilization management team. Assigned C-SNP Chronic Care Management coordinator, Peter Garter, to continue to follow.  Covering RNCM: Thea Silversmith, RN, MSN, [place] [phone]

## 2018-11-29 ENCOUNTER — Other Ambulatory Visit: Payer: Self-pay

## 2018-11-29 NOTE — Patient Outreach (Addendum)
  [facility]) Care Management Chronic Special Needs Program   11/29/2018  Name: Todd Chambers, DOB: 1950-08-12  MRN: [id number]  The client was discussed in today's interdisciplinary care team meeting.  The following issues were discussed:  Client's needs, Changes in health status, Care Plan, Coordination of care and Care transitions  Participants present:   Mahlon Gammon, MSN, RN, CCM, CNS    Thea Silversmith, MSN, RN, CCM   Lupita Rosales RN,BSN,CCM, CDE     Maryella Shivers, MD  Gilda Crease, PharmD, RPh Bary Castilla, RN, BSN, MS, CCM Coralie Carpen, MD  Recommendations:  Monitor client for adherence to follow up appt with provider and continued discontinuation of glipizide  Plan:  Plan to outreach on next scheduled call or sooner as needed  Follow-up:  One month  [place], Jackquline Denmark, [place] Management Coordinator [place] Management [phone]

## 2018-12-01 DIAGNOSIS — E785 Hyperlipidemia, unspecified: Secondary | ICD-10-CM | POA: Diagnosis not present

## 2018-12-01 DIAGNOSIS — Z09 Encounter for follow-up examination after completed treatment for conditions other than malignant neoplasm: Secondary | ICD-10-CM | POA: Diagnosis not present

## 2018-12-01 DIAGNOSIS — E118 Type 2 diabetes mellitus with unspecified complications: Secondary | ICD-10-CM | POA: Diagnosis not present

## 2018-12-01 DIAGNOSIS — N179 Acute kidney failure, unspecified: Secondary | ICD-10-CM | POA: Diagnosis not present

## 2018-12-01 DIAGNOSIS — E039 Hypothyroidism, unspecified: Secondary | ICD-10-CM | POA: Diagnosis not present

## 2018-12-01 DIAGNOSIS — Z8619 Personal history of other infectious and parasitic diseases: Secondary | ICD-10-CM | POA: Diagnosis not present

## 2018-12-01 DIAGNOSIS — Z794 Long term (current) use of insulin: Secondary | ICD-10-CM | POA: Diagnosis not present

## 2018-12-01 DIAGNOSIS — U071 COVID-19: Secondary | ICD-10-CM | POA: Diagnosis not present

## 2018-12-01 DIAGNOSIS — J9601 Acute respiratory failure with hypoxia: Secondary | ICD-10-CM | POA: Diagnosis not present

## 2018-12-15 ENCOUNTER — Other Ambulatory Visit: Payer: Self-pay | Admitting: Pharmacist

## 2018-12-15 NOTE — Patient Outreach (Signed)
[facility]) Care Management  12/15/2018  Todd Chambers 10/04/1950 [id number]   Patient was called for CSNP follow up . HIPAA identifiers were obtained.    Patient is a 68 year old male with multiple medical conditions including but not limited to:  CHF, CKD stage III, type 2 diabetes, hypothyroidism with secondary neuropathy, morbid obesity, and OSA.  Medications were reviewed via telephone:  Medications Reviewed Today    Reviewed by Elayne Guerin, [facility] (Pharmacist) on 12/15/18 at [phone]  Med List Status: <None>  Medication Order Taking? Sig Documenting Provider Last Dose Status Informant  allopurinol (ZYLOPRIM) 300 MG tablet [id number] Yes Take 300 mg by mouth daily. [provider] Taking Active Child  apixaban (ELIQUIS) 5 MG TABS tablet [id number] Yes Take 1 tablet by mouth 2 (two) times daily. [provider] Taking Active   aspirin 81 MG chewable tablet [id number] Yes Chew by mouth daily. [provider] Taking Active Child  Cholecalciferol (VITAMIN D-1000 MAX ST) 25 MCG (1000 UT) tablet [id number] Yes Take 1 capsule by mouth daily. [provider] Taking Active   glipiZIDE (GLUCOTROL XL) 10 MG 24 hr tablet [id number] Yes Take 10 mg by mouth daily with breakfast. [provider] Taking Active Child  labetalol (NORMODYNE) 200 MG tablet [id number] Yes Take 1 tablet by mouth 2 (two) times a day. [provider] Taking Active   lactulose (CHRONULAC) 10 GM/15ML solution [id number] No Take 20 g by mouth daily as needed for mild constipation. [provider] Unknown Active   latanoprost (XALATAN) 0.005 % ophthalmic solution [id number] Yes Place 1 drop into both eyes at bedtime. [provider] Taking Active   levothyroxine (SYNTHROID, LEVOTHROID) 100 MCG tablet [id number] Yes Take 100 mcg by mouth daily before breakfast. [provider] Taking Active Child  lovastatin (MEVACOR) 40 MG tablet [id number] Yes Take  40 mg by mouth at bedtime. [provider] Taking Active Child  NOVOLIN 70/30 RELION (70-30) 100 UNIT/ML injection [id number] Yes Take 95 units in the morning and 45.5 units in the evening [provider] Taking Active Child  Omega-3 Fatty Acids (FISH OIL PO) [id number] Yes Take by mouth. [provider] Taking Active Child           Med Note Luciana Axe, Angelia Mould   Fri Sep 15, 2018 11:50 AM)    ONE TOUCH ULTRA TEST test strip [id number] Yes USE TO TEST BLOOD SUGAR ONCE DAILY [provider] Taking Active   quinapril (ACCUPRIL) 20 MG tablet [id number] Yes Take 20 mg by mouth daily.  [provider] Taking Active Child  tadalafil (CIALIS) 5 MG tablet [id number] Yes Take 5 mg by mouth daily. [provider] Taking Active Child  tiZANidine (ZANAFLEX) 4 MG tablet [id number] Yes Take 4 mg by mouth every 6 (six) hours as needed for muscle spasms. [provider] Taking Active Child  torsemide (DEMADEX) 20 MG tablet [id number]  Take 1/2 tablet on Mondays and Fridays. [provider]  Active Child          Patient expressed concern about going into the donut hole and not being able to afford his medications.  We had been researching medication assistance options for the patient with Eliquis.  Unfortunately, he has not met the required 3% out-of-pocket medication expense required by [place] patient assistance program to be approved to receive Eliquis at no cost.  According to HTA, the patient's TROOP YTD is $147.  He would need around $1000.  His  TDS is around $2700  Patient will reach the donut hole when his total drug spend reaches $4020.  If deemed therapeutically appropriate by his provider, patient could be switched from [place] 70/30 to brand name 70/30 and get if from [place] Patient Assistance Program.  Due to [employer] has a free immediate supply coupon and they discontinued their out-of-pocket expense  requirement.  Plan: Call patient's PCP office to discuss the possibility of switching to brand name Novolin 70/30 and completing patient assistance paperwork.   Elayne Guerin, PharmD, [place] Clinical Pharmacist [phone]

## 2018-12-20 ENCOUNTER — Other Ambulatory Visit: Payer: Self-pay | Admitting: Pharmacist

## 2018-12-20 NOTE — Patient Outreach (Addendum)
[name] [facility]) Care Management  12/20/2018  Todd Chambers [birth year] [id number]  Patient was called to follow up on medication assistance. HIPAA identifiers were obtained. A note was sent to the patient's PCP office last week as well as a phone call was placed in reference to switching the patient to brand name Novo Nordisk insulins .  Patient has not met the out-of-pocket expense requirement for [place] Patient Assistance Program as they require patient's to spend at least 3% of their income in out-of-pocket medication expenses.  Patient said he was out of refills on:  Eliquis, Quinapril and Levothyroxine.  [facility] was called as a telephone note in [place] from his provider's office said the patient should have refills on file.  [facility] said they reached out to the provider multiple times without response about Eliquis.  The Pharmacist said Quinapril was too early as was not due to be filled until 01/14/2019. She also said they did not have levothyroxine on the patient's profile.  After researching, levothyroxine was filled at [facility].  Patient said he wanted all prescriptions at [facility] with the exception of his insulin that he buys at [facility].  Called the patient's provider and placed another refill request.  The person who answered the phone at the office said there was some confusion about the patient's pharmacy.  They were sending prescriptions to [facility].   The patient no longer has [employer].  Provider's office CMA called back and said they would handle all refills at his appointment tomorrow.  Since the quinapril was too early, patient said he would look around his house to see if he misplaced it.  Plan: Follow up with patient in 5-7 business days.  Elayne Guerin, PharmD, [place] Clinical Pharmacist [phone]

## 2018-12-25 ENCOUNTER — Other Ambulatory Visit: Payer: Self-pay

## 2018-12-25 NOTE — Patient Outreach (Signed)
  [facility]) Care Management Chronic Special Needs Program  12/25/2018  Name: Todd Chambers DOB: 02-Feb-1951  MRN: [id number]  Todd Chambers is enrolled in a chronic special needs plan for Diabetes. Client called with no answer No answer and HIPAA compliant message left. Plan for 2nd outreach call in one week Chronic care management coordinator will attempt outreach in one week.   Peter Garter RN, Jackquline Denmark, CDE Chronic Care Management Coordinator [facility] Care Management [phone]

## 2019-01-01 ENCOUNTER — Other Ambulatory Visit: Payer: Self-pay

## 2019-01-01 NOTE — Patient Outreach (Signed)
[facility] Care Management Chronic Special Needs Program  01/01/2019  Name: Todd KoyanagiJames Dekoning DOB: 01/10/1951  MRN: [id number]  Mr. Todd Chambers is enrolled in a chronic special needs plan for Diabetes. Reviewed and updated care plan.  Subjective: Client states that he still gets SOB with exertion and he is wearing his oxygen when he gets up and moves around.  States that his daughter has hired someone to come help him bath while he is recovering.  States his blood sugars have been good and have ranged 88-160 in the morning.  States he rarely has low readings if he does not eat enough.    Goals Addressed            This Visit's Progress   .  Acknowledge receipt of Advanced Directive package   On track   . Advanced Care Planning complete by next 9 months   On track   . Client understands the importance of follow-up with providers by attending scheduled visits   On track   . Client verbalize knowledge of Heart Failure diease self management skills by next 6 months   On track   . Client will use Assistive Devices as needed and verbalize understanding of device use   On track   . Client will verbalize knowledge of self management of Hypertension as evidences by BP reading of 140/90 or less; or as defined by provider   On track   . Decrease inpatient diabetes admissions/readmissions with in the year   On track   . Decrease the use of hospital emergency department related to diabetes within the next year    On track   . COMPLETED: General - Client will not be readmitted within 30 days (C-SNP)discharge date 11/20/2018       No readmission in 30 days post discharge 11/20/2018    . HEMOGLOBIN A1C < 7.0       Continue Diabetes self management actions:  Glucose monitoring per provider recommendations  Perform Quality checks on blood meter  Eat Healthy  Check feet daily  Visit provider every 3-6 months as directed  Hbg A1C level every 3-6 months.  Eye Exam yearly    . Maintain  timely refills of diabetic medication as prescribed within the year .   On track   . COMPLETED: Obtain annual  Lipid Profile, LDL-C       Completed 12/01/2018    . COMPLETED: Obtain Annual Eye (retinal)  Exam        Completed 10/11/2018    . Obtain Annual Foot Exam   On track   . Obtain annual screen for micro albuminuria (urine) , nephropathy (kidney problems)   On track   . Obtain Hemoglobin A1C at least 2 times per year   On track    Completed  08/05/2018, 12/01/2018    . Visit Primary Care Provider or Endocrinologist at least 2 times per year    On track    Client is not meeting diabetes self management goal of hemoglobin A1C of <7% with last reading 7.4%.  Agreeable to referral to social work to assist with Advanced Directives.  Client has been assisted by pharmacy with medication issues.  Plan:  Send successful outreach letter with a copy of their individualized care plan, Send individual care plan to provider and Send educational material.  Advanced Directives packet  Chronic care management coordinator will outreach in:  3 Months  Will refer to:  Social Work   Social research officer, governmentMelissa Sandlin RN,  BSN,CCM, [place] Management [phone]

## 2019-01-02 ENCOUNTER — Other Ambulatory Visit: Payer: Self-pay

## 2019-01-02 NOTE — Patient Outreach (Signed)
[facility]) Care Management  01/02/2019  Slaton Reaser Oct 06, 1950 [id number]   Social work referral received from [facility], Peter Garter to assist patient with Advance Directives.  Packet was mailed to patient on 01/01/19.  BSW will follow up within the next two weeks to ensure receipt of packet and review.  Ronn Melena, BSW Social Worker [phone]

## 2019-01-03 ENCOUNTER — Other Ambulatory Visit: Payer: Self-pay | Admitting: Pharmacist

## 2019-01-03 DIAGNOSIS — E119 Type 2 diabetes mellitus without complications: Secondary | ICD-10-CM | POA: Diagnosis not present

## 2019-01-03 DIAGNOSIS — R0602 Shortness of breath: Secondary | ICD-10-CM | POA: Diagnosis not present

## 2019-01-03 DIAGNOSIS — M170 Bilateral primary osteoarthritis of knee: Secondary | ICD-10-CM | POA: Diagnosis not present

## 2019-01-03 NOTE — Patient Outreach (Signed)
[facility]) Care Management  01/03/2019  Todd Chambers 10/08/1950 [id number]   Patient was called to follow up on medication assistance with insulin and refills on levothyroxine. A male answer the phone and said he was not there. HIPAA compliant message was left on the patient's voicemail.  Plan: Send patient an unsuccessful outreach letter. Call patient back in 2-3 weeks.   Elayne Guerin, PharmD, [place] Clinical Pharmacist [phone]

## 2019-01-16 ENCOUNTER — Other Ambulatory Visit: Payer: Self-pay

## 2019-01-16 NOTE — Patient Outreach (Signed)
[facility]) Care Management  01/16/2019  Dimetrius Montfort 06/13/1950 [id number]   Outreach to patient today to ensure receipt of Advance Directive packet that was mailed by [facility], Peter Garter, on 01/01/19.  Unfortunately, patient did not receive paperwork so it will be mailed again today.  Address corrected to [address](record had [address]). Will follow up again within the next two weeks to ensure receipt.  Ronn Melena, BSW Social Worker [phone]

## 2019-01-30 ENCOUNTER — Other Ambulatory Visit: Payer: Self-pay

## 2019-01-30 NOTE — Patient Outreach (Signed)
[facility]) Care Management  01/30/2019  Octave Montrose 1951/02/05 [id number]   Successful follow up outreach to patient today.  He confirmed receipt of Advance Directive packet and stated that his daughter is assisting with completion.  Provided patient with my contact information in case he or his daughter need further assistance.  Closing social work case at this time.  Ronn Melena, BSW Social Worker [phone]

## 2019-02-01 ENCOUNTER — Other Ambulatory Visit: Payer: Self-pay | Admitting: Pharmacist

## 2019-02-01 ENCOUNTER — Ambulatory Visit: Payer: Self-pay | Admitting: Pharmacist

## 2019-02-01 NOTE — Patient Outreach (Signed)
[facility]) Care Management  02/01/2019  Todd Chambers 01/26/1951 [id number]   Patient was called for CSNP follow up. Unfortunately, patient did not answer the phone. HIPAA compliant message was left on his voicemail.  Plan: Call patient back in 4 weeks.  Elayne Guerin, PharmD, [place] Clinical Pharmacist [phone]

## 2019-02-20 DIAGNOSIS — J984 Other disorders of lung: Secondary | ICD-10-CM | POA: Diagnosis not present

## 2019-02-20 DIAGNOSIS — R0602 Shortness of breath: Secondary | ICD-10-CM | POA: Diagnosis not present

## 2019-02-20 DIAGNOSIS — J9611 Chronic respiratory failure with hypoxia: Secondary | ICD-10-CM | POA: Diagnosis not present

## 2019-02-21 DIAGNOSIS — G4733 Obstructive sleep apnea (adult) (pediatric): Secondary | ICD-10-CM | POA: Diagnosis not present

## 2019-02-21 DIAGNOSIS — J984 Other disorders of lung: Secondary | ICD-10-CM | POA: Diagnosis not present

## 2019-02-21 DIAGNOSIS — G4734 Idiopathic sleep related nonobstructive alveolar hypoventilation: Secondary | ICD-10-CM | POA: Diagnosis not present

## 2019-02-21 DIAGNOSIS — J9611 Chronic respiratory failure with hypoxia: Secondary | ICD-10-CM | POA: Diagnosis not present

## 2019-02-21 DIAGNOSIS — Z8619 Personal history of other infectious and parasitic diseases: Secondary | ICD-10-CM | POA: Diagnosis not present

## 2019-03-01 ENCOUNTER — Ambulatory Visit: Payer: HMO

## 2019-03-08 DIAGNOSIS — Z01812 Encounter for preprocedural laboratory examination: Secondary | ICD-10-CM | POA: Diagnosis not present

## 2019-03-08 DIAGNOSIS — Z20828 Contact with and (suspected) exposure to other viral communicable diseases: Secondary | ICD-10-CM | POA: Diagnosis not present

## 2019-03-08 DIAGNOSIS — G4733 Obstructive sleep apnea (adult) (pediatric): Secondary | ICD-10-CM | POA: Diagnosis not present

## 2019-03-14 DIAGNOSIS — G4733 Obstructive sleep apnea (adult) (pediatric): Secondary | ICD-10-CM | POA: Diagnosis not present

## 2019-03-19 DIAGNOSIS — Z13228 Encounter for screening for other metabolic disorders: Secondary | ICD-10-CM | POA: Diagnosis not present

## 2019-03-19 DIAGNOSIS — I10 Essential (primary) hypertension: Secondary | ICD-10-CM | POA: Diagnosis not present

## 2019-03-19 DIAGNOSIS — Z23 Encounter for immunization: Secondary | ICD-10-CM | POA: Diagnosis not present

## 2019-03-19 DIAGNOSIS — Z131 Encounter for screening for diabetes mellitus: Secondary | ICD-10-CM | POA: Diagnosis not present

## 2019-03-19 DIAGNOSIS — Z130 Encounter for screening for diseases of the blood and blood-forming organs and certain disorders involving the immune mechanism: Secondary | ICD-10-CM | POA: Diagnosis not present

## 2019-03-19 DIAGNOSIS — E1140 Type 2 diabetes mellitus with diabetic neuropathy, unspecified: Secondary | ICD-10-CM | POA: Diagnosis not present

## 2019-03-20 ENCOUNTER — Other Ambulatory Visit: Payer: Self-pay | Admitting: Pharmacist

## 2019-03-20 NOTE — Patient Outreach (Addendum)
.[facility]) Care Management  03/20/2019  Todd Chambers [birth year] [id number]   Patient was called for CNSP follow up. HIPAA identifiers were obtained x2.  Patient is a 68 year old male with multiple medical conditions including but not limited to:  CHF, CKD stage III, type 2 diabetes, hypertension, hypothyroidism, morbid obesity, OSA, and polyneuropathy.  Patient reported feeling fine. He saw a Pulmonologist in [place] at the end of September and was started on Symbicort. Patient said he could not afford the copay so he did not pick it up and is not taking it.  Patient's medications were reviewed: Medications Reviewed Today    Reviewed by Todd Chambers, [facility] (Pharmacist) on 03/20/19 at 1000  Med List Status: <None>  Medication Order Taking? Sig Documenting Provider Last Dose Status Informant  allopurinol (ZYLOPRIM) 300 MG tablet [id number] Yes Take 300 mg by mouth daily. [provider] Taking Active Child  apixaban (ELIQUIS) 5 MG TABS tablet [id number] No Take 1 tablet by mouth 2 (two) times daily. [provider] Not Taking Active   aspirin 81 MG chewable tablet [id number] Yes Chew by mouth daily. [provider] Taking Active Child  budesonide-formoterol (SYMBICORT) 160-4.5 MCG/ACT inhaler [id number] Yes Inhale 2 puffs into the lungs 2 (two) times daily. [provider] Taking Active   Cholecalciferol (VITAMIN D-1000 MAX ST) 25 MCG (1000 UT) tablet [id number] Yes Take 1 capsule by mouth daily. [provider] Taking Active   glipiZIDE (GLUCOTROL XL) 10 MG 24 hr tablet [id number] Yes Take 10 mg by mouth daily with breakfast. [provider] Taking Active Child  labetalol (NORMODYNE) 200 MG tablet [id number] Yes Take 1 tablet by mouth 2 (two) times a day. [provider] Taking Active   lactulose (CHRONULAC) 10 GM/15ML solution [id number] Yes Take 20 g by mouth daily as needed for mild constipation. [provider] Taking Active   latanoprost (XALATAN) 0.005 % ophthalmic solution [id number] Yes Place 1 drop into both eyes at bedtime. [provider] Taking Active   levothyroxine (SYNTHROID, LEVOTHROID) 100 MCG tablet [id number] Yes Take 100 mcg by mouth daily before breakfast. [provider] Taking Active Child  lovastatin (MEVACOR) 40 MG tablet [id number] Yes Take 40 mg by mouth at bedtime. [provider] Taking Active Child  NOVOLIN 70/30 RELION (70-30) 100 UNIT/ML injection [id number] Yes Take 95 units in the morning and 45 units in the evening [provider] Taking Active Child  Omega-3 Fatty Acids (FISH OIL PO) [id number] Yes Take by mouth. [provider] Taking Active Child           Med Note Todd Chambers, Angelia Mould   Fri Sep 15, 2018 11:50 AM)    ONE TOUCH ULTRA TEST test strip [id number] Yes USE TO TEST BLOOD SUGAR ONCE DAILY [provider] Taking Active   quinapril (ACCUPRIL) 20 MG tablet [id number] Yes Take 20 mg by mouth daily.  [provider] Taking Active Child  tadalafil (CIALIS) 5 MG tablet [id number] Yes Take 5 mg by mouth daily. [provider] Taking Active Child  terbinafine (LAMISIL) 1 % cream [id number] Yes Apply topically at night as needed. [provider] Taking Active   tiZANidine (ZANAFLEX) 4 MG tablet [id number] Yes Take 4 mg by mouth every 6 (six) hours as needed for muscle spasms. [provider] Taking Active Child  torsemide (DEMADEX) 20 MG tablet [id number] Yes Take 1/2 tablet on Mondays, Wednesdays and Fridays. [provider] Taking Active Child  Findings  HgA1c- 8.2 % 10/20 up from 7.4% 12/01/18 Current therapy (reported by patient)- Novolin 70/30 95 units in morning and 45 units before supper  -Instructions on [facility] summary state 95 units in the morning and 50 units before supper. (Patient is taking 5 less units in the evening than what is documented  Symbicort-not  taking due to cost. Symbicort is available through [place] program but they require patient's to spend at least 2% of their household income in medication expenses.   According to HTA, patient's YTD medication spending is $271.  Called patient back to discuss med assistance findings but he did not answer the phone and a voicemail did not pick up.    Patient may be able to get Novolin 70/30 through patient assistance and/or use the Novo Immediate Supply Coupon.  He may also qualify for [employer] Patient Assistance Program to get Novolin 70/30  Plan: Follow up with patient in 7-10 business days.  Todd Chambers, PharmD, BCACP [facility] Clinical Pharmacist [phone]

## 2019-03-23 DIAGNOSIS — R300 Dysuria: Secondary | ICD-10-CM | POA: Diagnosis not present

## 2019-03-23 DIAGNOSIS — I10 Essential (primary) hypertension: Secondary | ICD-10-CM | POA: Diagnosis not present

## 2019-03-23 DIAGNOSIS — Z794 Long term (current) use of insulin: Secondary | ICD-10-CM | POA: Diagnosis not present

## 2019-03-23 DIAGNOSIS — E1149 Type 2 diabetes mellitus with other diabetic neurological complication: Secondary | ICD-10-CM | POA: Diagnosis not present

## 2019-03-23 DIAGNOSIS — E1165 Type 2 diabetes mellitus with hyperglycemia: Secondary | ICD-10-CM | POA: Diagnosis not present

## 2019-03-28 DIAGNOSIS — J449 Chronic obstructive pulmonary disease, unspecified: Secondary | ICD-10-CM | POA: Diagnosis not present

## 2019-03-30 DIAGNOSIS — E113593 Type 2 diabetes mellitus with proliferative diabetic retinopathy without macular edema, bilateral: Secondary | ICD-10-CM | POA: Diagnosis not present

## 2019-03-30 DIAGNOSIS — I10 Essential (primary) hypertension: Secondary | ICD-10-CM | POA: Diagnosis not present

## 2019-03-30 DIAGNOSIS — E785 Hyperlipidemia, unspecified: Secondary | ICD-10-CM | POA: Diagnosis not present

## 2019-03-30 DIAGNOSIS — F411 Generalized anxiety disorder: Secondary | ICD-10-CM | POA: Diagnosis not present

## 2019-04-03 ENCOUNTER — Telehealth: Payer: Self-pay | Admitting: Pharmacist

## 2019-04-03 ENCOUNTER — Other Ambulatory Visit: Payer: Self-pay

## 2019-04-03 NOTE — Patient Outreach (Signed)
[place] [facility]) Care Management Chronic Special Needs Program  04/03/2019  Name: Todd Chambers DOB: 1950-09-30  MRN: [id number]  Mr. Todd Chambers is enrolled in a chronic special needs plan for Diabetes. Reviewed and updated care plan.  Subjective: Client states he has been having more shortness of breath and he has been seeing his doctor about this.  States he is to see the pulmonologist again tomorrow.  States that he currently is not on oxygen but plans to talk to him about it again.  States he is currently not taking Eluquis as he can not afford it.  States he told his provider and he can not get any samples.  States his blood sugars are usually good unless he eats a snack late at night.  States he will get readings of 180-200 then.  States someone from [facility] visits him about every 2 wks  Goals Addressed            This Visit's Progress   . COMPLETED:  Acknowledge receipt of Advanced Directive package       Received packet    . Advanced Care Planning complete by next 9 months(continue 04/03/19)   On track   . Client understands the importance of follow-up with providers by attending scheduled visits   On track   . Client verbalize knowledge of Heart Failure diease self management skills by next 6 months(continue 04/03/19)   On track   . Client will use Assistive Devices as needed and verbalize understanding of device use   On track   . Client will verbalize knowledge of self management of Hypertension as evidences by BP reading of 140/90 or less; or as defined by provider   On track   . Decrease inpatient diabetes admissions/readmissions with in the year   On track   . Decrease the use of hospital emergency department related to diabetes within the next year    On track   . HEMOGLOBIN A1C < 7.0        Continue Diabetes self management actions:  Glucose monitoring per provider recommendations  Perform Quality checks on blood meter  Eat Healthy  Check feet  daily  Visit provider every 3-6 months as directed  Hbg A1C level every 3-6 months.  Eye Exam yearly    . Maintain timely refills of diabetic medication as prescribed within the year .   On track   . Obtain Annual Foot Exam   On track   . COMPLETED: Obtain annual screen for micro albuminuria (urine) , nephropathy (kidney problems)       Completed 03/23/19    . COMPLETED: Visit Primary Care Provider or Endocrinologist at least 2 times per year        Visits 08/10/18, 12/21/18, 02/17/19     Client is not meeting diabetes self management goal of hemoglobin A1C of <7% with last reading of 8.2% Instructed client that Todd Chambers will notify Todd Chambers, RPh to about the cost of his Eliquis Encouraged to discuss with pulmonologist about home oxygen  Reviewed low CHO, low sodium diet and to watch his portion sizes Instructed to try to eat lower CHO snacks at night Reviewed s/s of HF and when to call MD Reviewed number for 24-hour nurse Line Reviewed COVID 19 precautions  Plan:  Send successful outreach letter with a copy of their individualized care plan and Send individual care plan to provider  Chronic care management coordinator will outreach in:  3 Months  Will refer to:  Pharmacy currently active with pharmacy, Todd Chambers, RPh [facility] pharmacist messaged concerning medication cost issue.   Todd Major RN, Todd Chambers, CDE Chronic Care Management Coordinator [facility] Care Management [phone]

## 2019-04-03 NOTE — Telephone Encounter (Signed)
-----   Message from Dimitri Ped, RN sent at 04/03/2019 11:41 AM EST ----- Regarding: Client not taking Eliquis Alwyn Ren, Client reports he is not taking Eliquis due to cost as he is in coverage gap Please see if you can assist him Thanks Lenna Sciara

## 2019-04-03 NOTE — Patient Outreach (Signed)
[place] [facility]) Care Management  04/03/2019  Zakir Henner 31-Mar-1951 [id number]   Patient was called regarding medication assistance with Eliquis. Unfortunately, he did not answer the phone. Phone rang >20 times without a voicemail pick up.  Patient expressed concern about paying for Eliquis to [place] nurse today. Unfortunately, [place] Patient Assistance Program requires patients to spend at least 3% of the total house hold income on medication expenses.  According to HTA, patient's YTD TROOP is $277.61.  Patient was called back on 10/272020 to discuss his TROOP unsuccessfully.  Plan: Send unsuccessful outreach letter. Call patient back in 7-10 business days.  Elayne Guerin, PharmD, [place] Clinical Pharmacist [phone]

## 2019-04-04 DIAGNOSIS — G4733 Obstructive sleep apnea (adult) (pediatric): Secondary | ICD-10-CM | POA: Diagnosis not present

## 2019-04-04 DIAGNOSIS — Z6841 Body Mass Index (BMI) 40.0 and over, adult: Secondary | ICD-10-CM | POA: Diagnosis not present

## 2019-04-04 DIAGNOSIS — J9611 Chronic respiratory failure with hypoxia: Secondary | ICD-10-CM | POA: Diagnosis not present

## 2019-04-05 ENCOUNTER — Ambulatory Visit: Payer: Self-pay | Admitting: Pharmacist

## 2019-04-12 ENCOUNTER — Other Ambulatory Visit: Payer: Self-pay | Admitting: Pharmacist

## 2019-04-12 NOTE — Patient Outreach (Signed)
[place] [facility]) Care Management  04/12/2019  Todd Chambers 12-14-1950 [id number]   Patient was called to follow up on medication assistance with Eliquis. Unfortunately, he did not answer the phone. HIPAA compliant message was left on his voicemail.   Since it is the end of the year and the patient has not spent the required 3% of household income in out-of-pocket medication expenses, he will be called next year.  Plan: Call patient back in 3-4 months.  Elayne Guerin, PharmD, [place] Clinical Pharmacist [phone]

## 2019-04-16 ENCOUNTER — Ambulatory Visit: Payer: Self-pay | Admitting: Pharmacist

## 2019-04-17 DIAGNOSIS — J9611 Chronic respiratory failure with hypoxia: Secondary | ICD-10-CM | POA: Diagnosis not present

## 2019-05-04 ENCOUNTER — Other Ambulatory Visit: Payer: Self-pay | Admitting: Pharmacist

## 2019-05-04 NOTE — Patient Outreach (Signed)
[facility]) Care Management  05/04/2019  Atiba Kimberlin 1950/11/19 [id number]  Received a phone call from [place] from [place] asking that I reach back out to the patient about medication assistance. Patient has been called multiple times without success.  Reason for referral: medication assistance Referral medication(s): Eliquis Current insurance: [employer] CSNP  HPI:  Patient was called regarding medication assistance. HIPAA identifiers were obtained.  Patient is a 68 year old male with multiple medical conditions including but not limited to:  History of pulmonary embolism, CKD stage III, type 2 diabetes with secondary neuropathy, hypothyroidism, obesity, and OSA.   Patient said he has not been able to afford to purchase Eliquis and Symbicort.  Objective: Allergies  Allergen Reactions  . Pioglitazone Other (See Comments)    Patient did not remember the reaction  . Statins Other (See Comments)    Patient did not remember the reaction  . Oxycodone-Acetaminophen Nausea Only  . Penicillins Rash    Did it involve swelling of the face/tongue/throat, SOB, or low BP? No Did it involve sudden or severe rash/hives, skin peeling, or any reaction on the inside of your mouth or nose? No Did you need to seek medical attention at a hospital or doctor's office? No When did it last happen?Childhood If all above answers are "NO", may proceed with cephalosporin use.    Medications Reviewed Today    Reviewed by Elayne Guerin, [facility] (Pharmacist) on 05/04/19 at [place] List Status: <None>  Medication Order Taking? Sig Documenting Provider Last Dose Status Informant  allopurinol (ZYLOPRIM) 300 MG tablet [id number] Yes Take 300 mg by mouth daily. [provider] Taking Active Child  apixaban (ELIQUIS) 5 MG TABS tablet [id number] No Take 1 tablet by mouth 2 (two) times daily. [provider] Not Taking Active            Med Note Elayne Guerin   Tue Mar 20, 2019 10:01 AM) Not taking Due to Cost  aspirin 81 MG chewable tablet [id number] Yes Chew by mouth daily. [provider] Taking Active Child  budesonide-formoterol (SYMBICORT) 160-4.5 MCG/ACT inhaler [id number]  Inhale 2 puffs into the lungs 2 (two) times daily. [provider]  Expired 03/23/19 2359   Cholecalciferol (VITAMIN D-1000 MAX ST) 25 MCG (1000 UT) tablet [id number] Yes Take 1 capsule by mouth daily. [provider] Taking Active   glipiZIDE (GLUCOTROL XL) 10 MG 24 hr tablet [id number] Yes Take 10 mg by mouth daily with breakfast. [provider] Taking Active Child  labetalol (NORMODYNE) 200 MG tablet [id number] Yes Take 1 tablet by mouth 2 (two) times a day. [provider] Taking Active   lactulose (CHRONULAC) 10 GM/15ML solution [id number] Yes Take 20 g by mouth daily as needed for mild constipation. [provider] Taking Active   latanoprost (XALATAN) 0.005 % ophthalmic solution [id number] Yes Place 1 drop into both eyes at bedtime. [provider] Taking Active   levothyroxine (SYNTHROID, LEVOTHROID) 100 MCG tablet [id number] Yes Take 100 mcg by mouth daily before breakfast. [provider] Taking Active Child  lovastatin (MEVACOR) 40 MG tablet [id number] Yes Take 40 mg by mouth at bedtime. [provider] Taking Active Child  NOVOLIN 70/30 RELION (70-30) 100 UNIT/ML injection [id number] Yes Take 95 units in the morning and 45 units in the evening [provider] Taking Active Child  Omega-3 Fatty Acids (FISH OIL PO) [id number] Yes Take by mouth. [provider] Taking Active Child  Med Note Elayne Guerin   Fri Sep 15, 2018 11:50 AM)    ONE TOUCH ULTRA TEST test strip [id number] Yes USE TO TEST BLOOD SUGAR ONCE DAILY [provider] Taking Active   quinapril (ACCUPRIL) 20 MG tablet [id number] Yes Take 20 mg by mouth daily.  [provider] Taking Active Child  tadalafil  (CIALIS) 5 MG tablet [id number] Yes Take 5 mg by mouth daily. [provider] Taking Active Child  terbinafine (LAMISIL) 1 % cream [id number] Yes Apply topically at night as needed. [provider] Taking Active   tiZANidine (ZANAFLEX) 4 MG tablet [id number] Yes Take 4 mg by mouth every 6 (six) hours as needed for muscle spasms. [provider] Taking Active Child  torsemide (DEMADEX) 20 MG tablet [id number] Yes Take 1/2 tablet on Mondays, Wednesdays and Fridays. [provider] Taking Active Child          Assessment:  Drugs sorted by system:  Cardiovascular: Eliquis (not taking), Aspirin, Labetalol, Lovastatin, Quinapril, Torsemide  Pulmonary/Allergy: Symbicort (not taking)  Endocrine: Glipizide, Levothyroxine, Novolin 70/30  Renal: Allopurinol  Topical: Terbinafine  Pain: Tizanidine  Genitourinary: Tadalafil  Vitamins/Minerals/Supplements: Cholecalciferol, Omega 3 Fatty-Acid,  Miscellaneous: Latanoprost  Medication Review Findings:  . HgA1c-7.5% 07/2018 o On Statin-Lovastatin last filled-01/02/2019 for #90 (Patient said he was planning on calling in a refill today)  Adherence- Patient not taking Symbicort or Eliquis due to cost.  Medication Assistance Findings:  Medication assistance needs identified: Eliquis, Symbicort, and Novolin 70/30   Patient is over income for LIS/Extra Help. He MAY qualify to receive Novolin 70/30 from [employer] if his provider re-writes his prescription for Novolin 70/30 vs Relion Novolin 70/30.    [employer] provides Symbicort through their program but they require patients to spend at least 2% of their income in medication expenses. The patient did not meet that amount this year and he will not have met it at the beginning of the year.  If deemed therapeutically appropriate, his provider could switch him to [facility] as it is available through [employer] and does not require an out-of-pocket  expenditure.  Eliquis is provided through [employer] Program. They require patients to spend at least 3% of their household income on medication expenses to qualify.Hopefully since the new benefit year will start 05/24/2018, the patient will be able to restart Eliquis therapy. However, non-compliance with Eliquis can place the patient in a "hyercoaguable state".     Additional medication assistance options reviewed with patient as warranted:  No other options identified  Plan: I will route patient assistance letter to [place] technician who will coordinate patient assistance program application process for medications listed above.  [facility] pharmacy technician will assist with obtaining all required documents from both patient and provider(s) and submit application(s) once completed.    Will follow up with the patient in 3-4 weeks.  Will route note to PCP requesting therapeutic switches.  Elayne Guerin, PharmD, [place] Clinical Pharmacist [phone]

## 2019-05-08 ENCOUNTER — Other Ambulatory Visit: Payer: Self-pay | Admitting: Pharmacy Technician

## 2019-05-08 NOTE — Patient Outreach (Signed)
[facility]) Care Management  05/08/2019  Todd Chambers 09-14-1950 [id number]                                       Medication Assistance Referral  Referral From: [place]  Medication/Company: [name] / [employer] Patient application portion:  Education officer, museum portion:  Mailed to Dr. Edison Pace Provider address/fax verified via: Office website  Medication/Company: Novolin 70/30 / [employer] Patient application portion:  Education officer, museum portion: Faxed  to Dr. Edison Pace on or after 05/25/2019 Provider address/fax verified via: Call to office    Follow up:  Will follow up with patient in 20-30 business days to confirm application(s) have been received.  Todd Chambers P. Khalifa Knecht, [place] Management [phone]

## 2019-05-09 ENCOUNTER — Ambulatory Visit: Payer: Self-pay | Admitting: Pharmacist

## 2019-05-17 DIAGNOSIS — J9611 Chronic respiratory failure with hypoxia: Secondary | ICD-10-CM | POA: Diagnosis not present

## 2019-05-25 DEATH — deceased

## 2019-05-30 ENCOUNTER — Other Ambulatory Visit: Payer: Self-pay | Admitting: Pharmacy Technician

## 2019-05-30 NOTE — Patient Outreach (Signed)
[facility]) Care Management  05/30/2019  Todd Chambers 1950/06/19 [id number]  Successful outreach call placed to patient in regards to [employer] application for [employer] application for Novolin 70/30.  A lady answered the phone and when I inquired if I could speak to Todd Chambers, she informed that the patient passed away the day after Christmas.  Will route note to [facility] RPh Nunzio Cobbs and remove myself from care team.  Stacie Acres. Cristin Szatkowski, CPhT Musician Care Management [phone]

## 2019-05-31 ENCOUNTER — Telehealth: Payer: Self-pay | Admitting: Pharmacist

## 2019-05-31 NOTE — Patient Outreach (Signed)
[facility]) Care Management  05/31/2019  Todd Chambers 11-17-1950 [id number]   Received a phone call from [facility], CPhT stating that she called the patient and a woman answered the phone and said the patient passed 06/18/2019.  Noreene Larsson said she did an Therapist, art and found the patient's obituary online.  CareEveryWhere was researched but there was no documentation found,    Plan: Close patient's pharmacy case. Route note to [facility] CSNP nurse still involved in the patient's care.  [name], PharmD, BCACP [facility] Clinical Pharmacist [phone]

## 2019-06-15 ENCOUNTER — Other Ambulatory Visit: Payer: Self-pay

## 2019-06-15 NOTE — Patient Outreach (Signed)
  [facility]) Care Management Chronic Special Needs Program    06/15/2019  Name: Todd Chambers, DOB: 10/05/1950  MRN: [id number]   Mr. Todd Chambers was enrolled in a chronic special needs plan for Diabetes. Case closed as client expired on May 21, 2019 Plan to send case closure letter to MD  Dudley Major RN, [facility], CDE Chronic Care Management Coordinator [facility] Care Management [phone]

## 2019-06-19 ENCOUNTER — Ambulatory Visit: Payer: Self-pay

## 2019-06-22 ENCOUNTER — Ambulatory Visit: Payer: Self-pay | Admitting: Pharmacist

## 2019-07-23 ENCOUNTER — Ambulatory Visit: Payer: Self-pay | Admitting: Pharmacist

## 2019-08-07 IMAGING — CT CT ANGIOGRAPHY CHEST
1 of 8 series · 2 of 16 positions shown · IV contrast (iopamidol)
Comparison: None.

CLINICAL DATA: Dyspnea x2 weeks with exertion.  Positive D-dimer.

EXAM:
CT ANGIOGRAPHY CHEST WITH CONTRAST
TECHNIQUE: Multidetector CT imaging of the chest was performed using the
standard protocol during bolus administration of intravenous
contrast. Multiplanar CT image reconstructions and MIPs were
obtained to evaluate the vascular anatomy.
CONTRAST:  100mL HKD1EM-UVC IOPAMIDOL (HKD1EM-UVC) INJECTION 76%

[Series 10: pe thins · axial · 0.76mm/px · z∈[-218,-129]mm · 2 of 269 slices shown]
[im 90/269  lung]
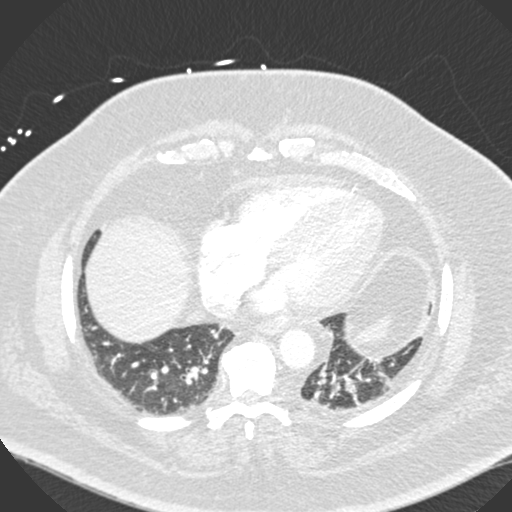
[im 179/269  soft-tissue]
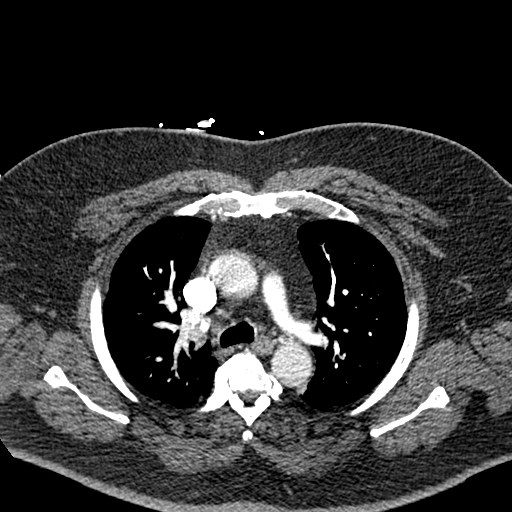

[2 of 16 positions shown; findings below may reference images not displayed]

FINDINGS: Cardiovascular: Conventional branch pattern of the great vessels
with mild atherosclerosis the origin of the left subclavian artery.
Ectatic thoracic aorta without aneurysm or dissection. Satisfactory
pulmonary arterial opacification with tiny nonocclusive bandlike
pulmonary emboli to the right lower lobe. No right heart strain.
Cardiomegaly without pericardial effusion or thickening.

Mediastinum/Nodes: Small hilar nodes measuring up to 1 cm. No
mediastinal, axillary nor supraclavicular adenopathy. Patent trachea
and mainstem bronchi. Esophagus is unremarkable.

Lungs/Pleura: Scarring atelectasis at the right lung apex and upper
lobe. Dependent atelectasis is otherwise noted at each lung base
with subsegmental atelectasis the periphery of lingula. No or
pneumothorax.

Upper Abdomen: No acute abnormality.

Musculoskeletal: Diffuse idiopathic skeletal hyperostosis of the
thoracic spine.

Review of the MIP images confirms the above findings.
IMPRESSION: 1. Nonocclusive pulmonary emboli to the right lobe without heart
strain. These results were called by telephone at the time of
interpretation on 08/05/2018 at [DATE] to Dr. MERAV MATHIS , who
verbally acknowledged these results.
2. No active pulmonary disease.  Subsegmental atelectasis as above.

## 2019-08-07 IMAGING — CR CHEST - 2 VIEW
2 series · 2 of 2 positions shown · non-contrast
Comparison: 08/03/2018

CLINICAL DATA: Dyspnea worse with exertion.

EXAM:
CHEST - 2 VIEW

[w chest pa]
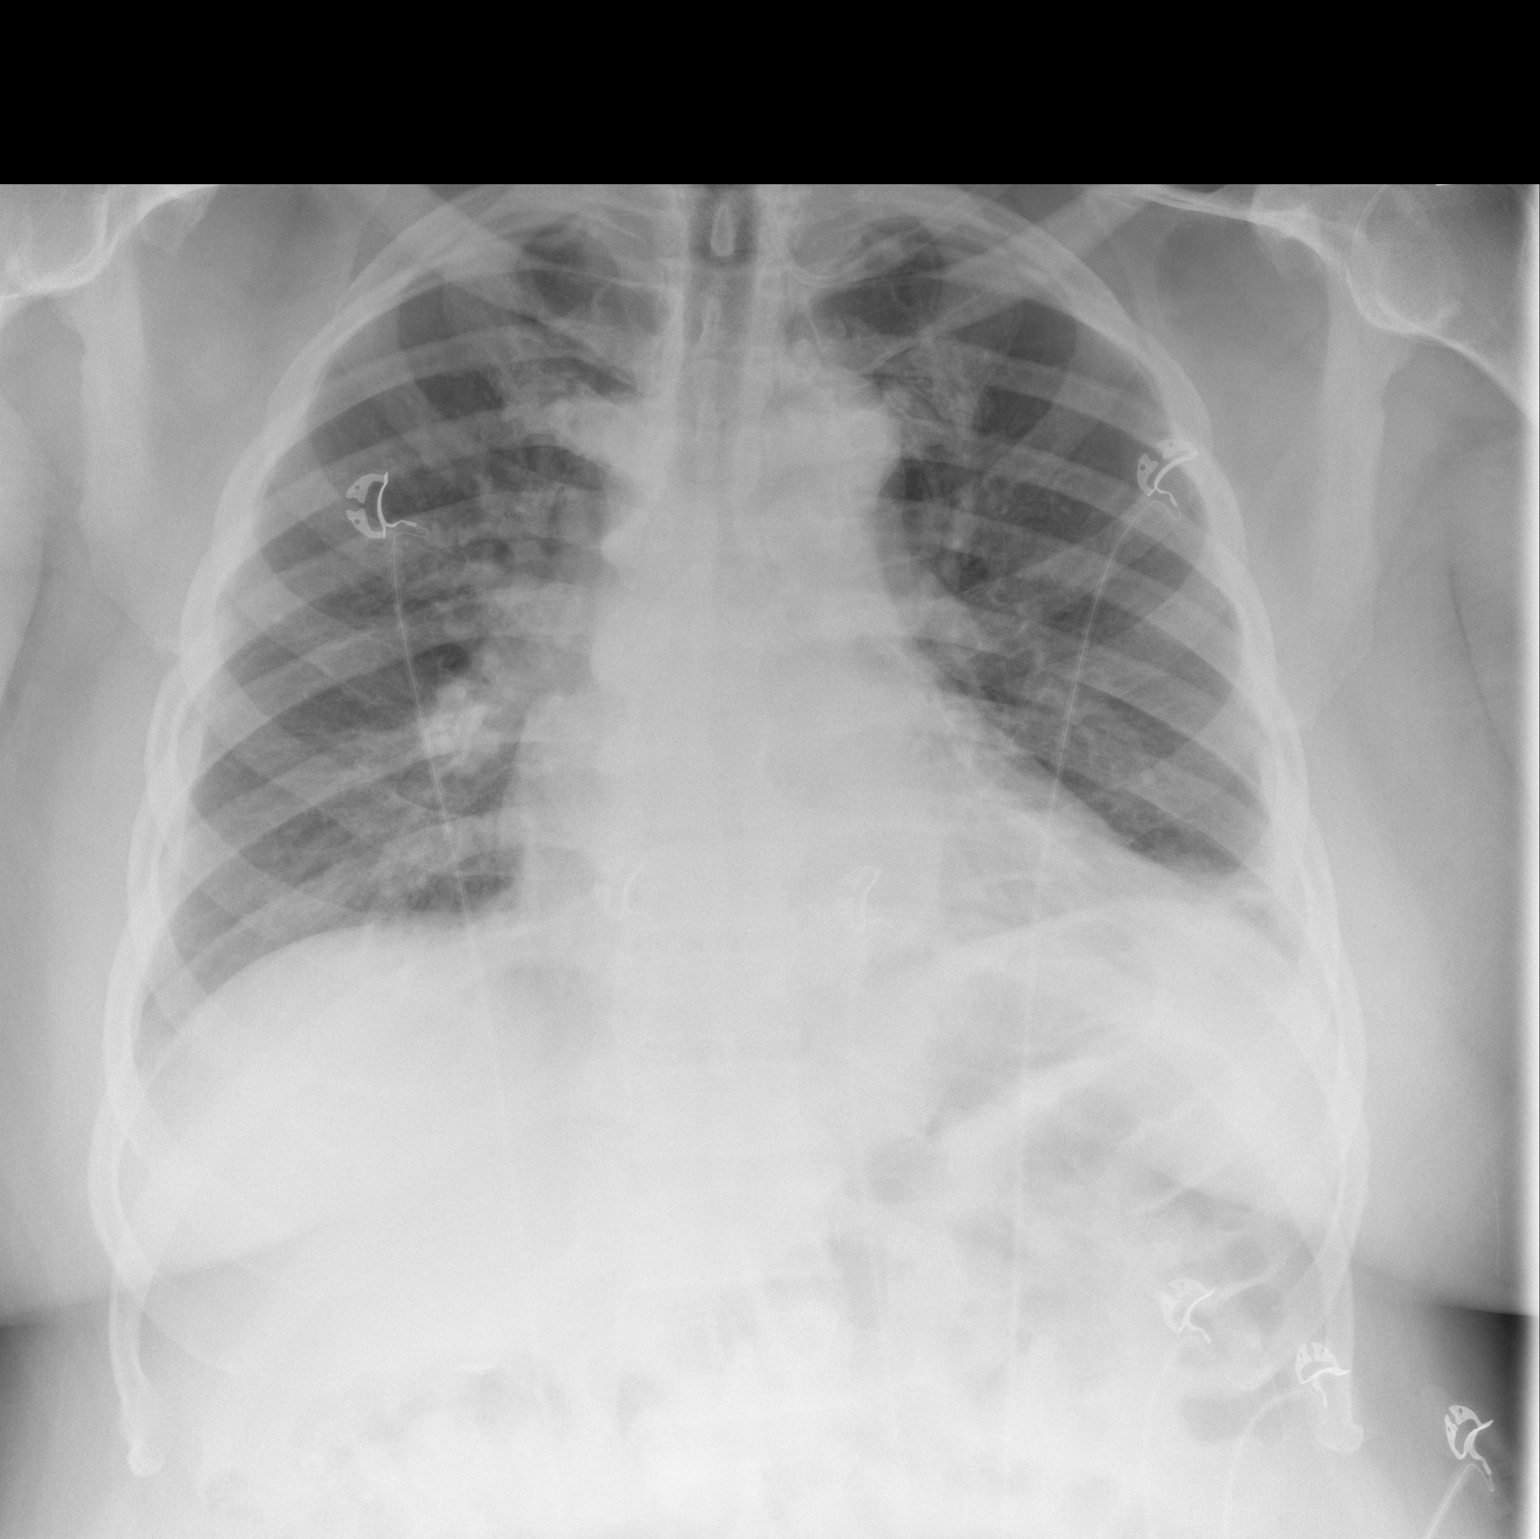

[w chest lat *]
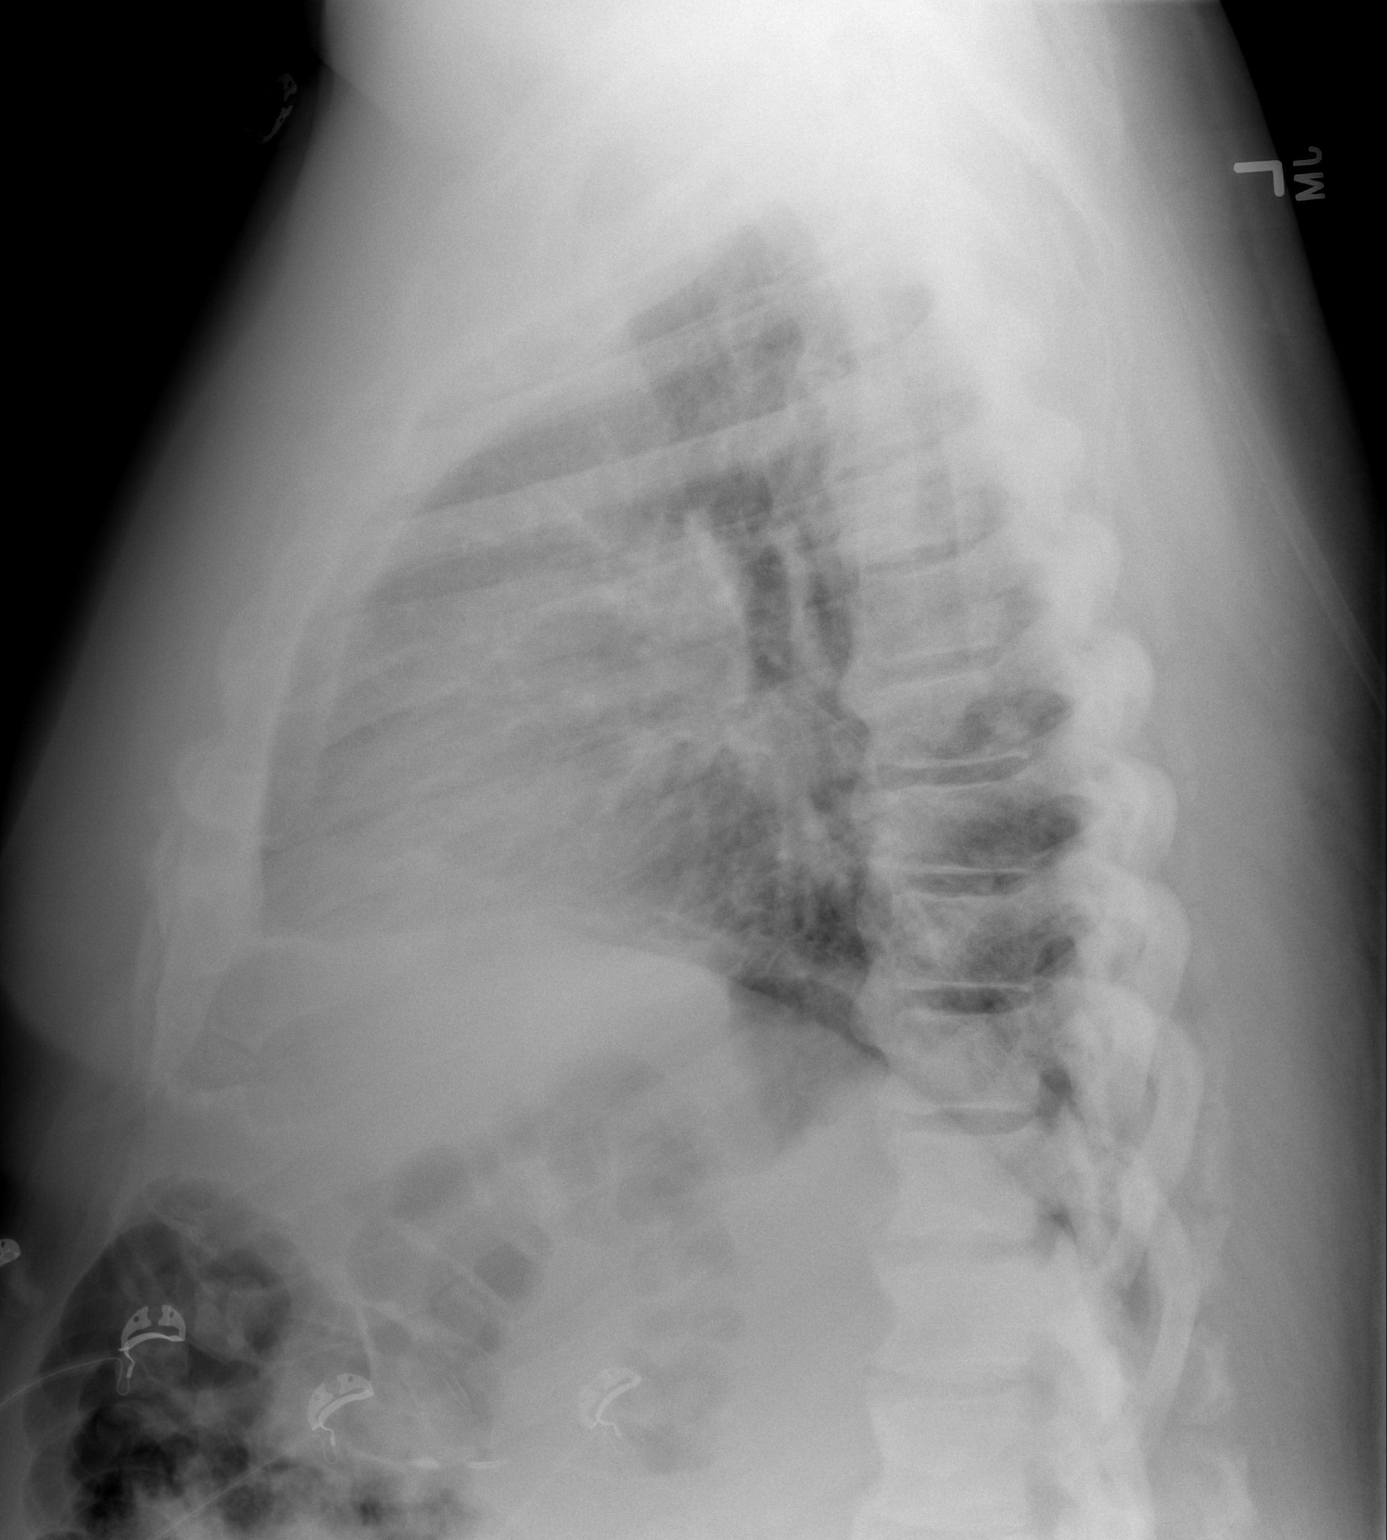

[2 of 2 positions shown; findings below may reference images not displayed]

FINDINGS: Stable cardiomegaly with aortic atherosclerosis. Mild central
vascular congestion is noted left basilar atelectasis is present. No
acute nor suspicious osseous abnormalities. Degenerative change
noted about both shoulders and dorsal spine.
IMPRESSION: Cardiomegaly with central vascular congestion.
